# Patient Record
Sex: Male | Born: 1950 | Race: White | Hispanic: No | Marital: Married | Smoking: Never smoker
Health system: Southern US, Community
[De-identification: ages and names within clinical notes are randomized; demographics above are authoritative.]

## PROBLEM LIST (undated history)

## (undated) DIAGNOSIS — I10 Essential (primary) hypertension: Secondary | ICD-10-CM

## (undated) DIAGNOSIS — K219 Gastro-esophageal reflux disease without esophagitis: Secondary | ICD-10-CM

## (undated) HISTORY — PX: COLONOSCOPY, ESOPHAGOGASTRODUODENOSCOPY (EGD) AND ESOPHAGEAL DILATION: SHX5781

---

## 2004-04-13 ENCOUNTER — Ambulatory Visit (HOSPITAL_COMMUNITY): Admission: RE | Admit: 2004-04-13 | Discharge: 2004-04-13 | Payer: Self-pay | Admitting: Internal Medicine

## 2004-05-23 ENCOUNTER — Ambulatory Visit: Payer: Self-pay | Admitting: Internal Medicine

## 2004-06-07 ENCOUNTER — Ambulatory Visit: Payer: Self-pay | Admitting: Internal Medicine

## 2004-06-07 ENCOUNTER — Ambulatory Visit (HOSPITAL_COMMUNITY): Admission: RE | Admit: 2004-06-07 | Discharge: 2004-06-07 | Payer: Self-pay | Admitting: Internal Medicine

## 2004-08-08 ENCOUNTER — Ambulatory Visit: Payer: Self-pay | Admitting: Internal Medicine

## 2005-09-28 ENCOUNTER — Ambulatory Visit: Payer: Self-pay | Admitting: Internal Medicine

## 2008-12-14 ENCOUNTER — Ambulatory Visit (HOSPITAL_COMMUNITY): Admission: RE | Admit: 2008-12-14 | Discharge: 2008-12-14 | Payer: Self-pay | Admitting: Internal Medicine

## 2008-12-18 ENCOUNTER — Ambulatory Visit (HOSPITAL_COMMUNITY): Admission: RE | Admit: 2008-12-18 | Discharge: 2008-12-18 | Payer: Self-pay | Admitting: Internal Medicine

## 2009-03-16 ENCOUNTER — Ambulatory Visit (HOSPITAL_COMMUNITY): Admission: RE | Admit: 2009-03-16 | Discharge: 2009-03-16 | Payer: Self-pay | Admitting: Internal Medicine

## 2010-03-31 ENCOUNTER — Ambulatory Visit (HOSPITAL_COMMUNITY): Admission: RE | Admit: 2010-03-31 | Discharge: 2010-03-31 | Payer: Self-pay | Admitting: Family Medicine

## 2010-04-05 ENCOUNTER — Ambulatory Visit (HOSPITAL_COMMUNITY): Admission: RE | Admit: 2010-04-05 | Discharge: 2010-04-05 | Payer: Self-pay | Admitting: Family Medicine

## 2010-04-26 ENCOUNTER — Ambulatory Visit (HOSPITAL_COMMUNITY): Admission: RE | Admit: 2010-04-26 | Discharge: 2010-04-26 | Payer: Self-pay | Admitting: Orthopaedic Surgery

## 2010-08-30 LAB — DIFFERENTIAL
Basophils Absolute: 0 10*3/uL (ref 0.0–0.1)
Basophils Relative: 0 % (ref 0–1)
Eosinophils Absolute: 0.2 10*3/uL (ref 0.0–0.7)
Eosinophils Relative: 2 % (ref 0–5)
Lymphocytes Relative: 29 % (ref 12–46)
Lymphs Abs: 2.6 10*3/uL (ref 0.7–4.0)
Monocytes Absolute: 0.7 10*3/uL (ref 0.1–1.0)
Monocytes Relative: 8 % (ref 3–12)
Neutro Abs: 5.5 10*3/uL (ref 1.7–7.7)
Neutrophils Relative %: 60 % (ref 43–77)

## 2010-08-30 LAB — SURGICAL PCR SCREEN
MRSA, PCR: NEGATIVE
Staphylococcus aureus: NEGATIVE

## 2010-08-30 LAB — COMPREHENSIVE METABOLIC PANEL
ALT: 32 U/L (ref 0–53)
AST: 22 U/L (ref 0–37)
Albumin: 4.3 g/dL (ref 3.5–5.2)
Alkaline Phosphatase: 65 U/L (ref 39–117)
BUN: 12 mg/dL (ref 6–23)
CO2: 27 mEq/L (ref 19–32)
Calcium: 9.8 mg/dL (ref 8.4–10.5)
Chloride: 101 mEq/L (ref 96–112)
Creatinine, Ser: 1.14 mg/dL (ref 0.4–1.5)
GFR calc Af Amer: >60 mL/min (ref >60)
GFR calc non Af Amer: >60 mL/min (ref >60)
Glucose, Bld: 109 mg/dL — ABNORMAL HIGH (ref 70–99)
Potassium: 4 mEq/L (ref 3.5–5.1)
Sodium: 135 mEq/L (ref 135–145)
Total Bilirubin: 0.6 mg/dL (ref 0.3–1.2)
Total Protein: 7.3 g/dL (ref 6.0–8.3)

## 2010-08-30 LAB — URINALYSIS, ROUTINE W REFLEX MICROSCOPIC
Bilirubin Urine: NEGATIVE
Glucose, UA: NEGATIVE mg/dL
Hgb urine dipstick: NEGATIVE
Ketones, ur: NEGATIVE mg/dL
Nitrite: NEGATIVE
Protein, ur: NEGATIVE mg/dL
Specific Gravity, Urine: 1.01 (ref 1.005–1.030)
Urobilinogen, UA: 0.2 mg/dL (ref 0.0–1.0)
pH: 5.5 (ref 5.0–8.0)

## 2010-08-30 LAB — CBC
HCT: 43.6 % (ref 39.0–52.0)
Hemoglobin: 14.7 g/dL (ref 13.0–17.0)
MCH: 32.3 pg (ref 26.0–34.0)
MCHC: 33.7 g/dL (ref 30.0–36.0)
MCV: 96.1 fL (ref 78.0–100.0)
Platelets: 273 10*3/uL (ref 150–400)
RBC: 4.54 MIL/uL (ref 4.22–5.81)
RDW: 13.8 % (ref 11.5–15.5)
WBC: 9.1 10*3/uL (ref 4.0–10.5)

## 2010-11-04 NOTE — Op Note (Signed)
NAME:  Derek Ali, Derek Ali                 ACCOUNT NO.:  [id number]   MEDICAL RECORD NO.:  [id number]          PATIENT TYPE:  AMB   LOCATION:  DAY                           FACILITY:  [facility]   PHYSICIAN:  Lionel December, M.D.    DATE OF BIRTH:  [birth year]   DATE OF PROCEDURE:  06/07/2004  DATE OF DISCHARGE:                                 OPERATIVE REPORT   PROCEDURE:  Esophagogastroduodenoscopy with esophageal dilation followed  total colonoscopy with polypectomy.   INDICATIONS:  The patient is 60 year old Caucasian male who has chronic GERD  who now presents with dysphagia primarily to solids but at times he has  difficulty with liquids. He has history of distal esophageal ring and was  last dilated in July 2000. He will also undergo screening colonoscopy.  Family history is negative for colorectal carcinoma. Procedure and risks  were reviewed with the patient and informed consent was obtained.   PREOPERATIVE MEDICATIONS:  Cetacaine spray for pharyngeal topical  anesthesia, Demerol 50 mg IV, Versed 9 IV mg in divided doses.   FINDINGS:  Procedure performed in endoscopy suite. The patient's vital signs  and O2 saturations were monitored during the procedure and remained stable.   PROCEDURE #1:  Esophagogastroduodenoscopy. The patient was placed in left  lateral position, and Olympus video scope was passed via oropharynx without  any difficulty into the esophagus. There was fine wrinkling to mucosa of the  esophagus and body, suspicious for eosinophilic esophagitis. There was  incomplete ring at the GE junction which was at 38 cm. Hiatus was at 40 cm.   Stomach:  It was empty and distended very well with insufflation. Folds of  the proximal stomach were normal. Examination of mucosa at body, antrum,  pyloric channel as well as angularis, fundus, and cardia was normal.   Duodenum:  Bulbar mucosa was normal. The scope was passed to the second part  of the duodenum. Mucosa and folds were  normal. Endoscope was withdrawn.   Esophagus was dilated by passing 56-French Maloney dilator to full  insertion. As the dilator was withdrawn, endoscope was passed again. There  was about 2-cm tear from 25 to 27 cm from the incisors. The 2 other tears  were much smaller, located at and just above GE junction. Pictures taken for  the record. Endoscope was withdrawn, and the patient prepared for procedure  #2.   PROCEDURE #2. Colonoscopy. Rectal examination performed. No abnormality  noted on external or digital exam. Olympus video scope was placed in the  rectum and advanced into sigmoid colon and beyond. Preparation was  satisfactory. Scope was advanced to the cecum which was identified by  appendiceal orifice and ileocecal valve. There was a 6 to 7 mm sessile polyp  next to appendiceal orifice. This was raised with submucosal injection of  normal saline and snared. Part of it was snared; the rest was left but was  easily coagulated using snares. Another 5-mm flat polyp was noted at the  hepatic flexure which was snared and retrieved for histologic examination.  There were 2 small polyps at proximal sigmoid  colon, both of which were  coagulated using snare tip. Mucosa of the rest of the colon was normal. The  rectal mucosa similarly was normal. Scope was retroflexed to examine  anorectal junction, and small hemorrhoids were noted below the dentate line.  The scope was straightened and withdrawn. The patient tolerated the  procedure well.   FINAL DIAGNOSES:  1.  Distal esophageal ring and a small sliding hiatal hernia. Mucosa      appearance at body also suggests eosinophilic esophagitis. Esophagus      dilated by passing 56-French Maloney dilator resulting in mucosal      disruption at proximal body and distal esophagus.  2.  Normal examination of the stomach, first and second portion of the      duodenum.  3.  Two small polyps snared, one from cecum and second one from hepatitic       flexure.  4.  Two more polyps were coagulated, both of which were in sigmoid colon.  5.  Small external hemorrhoids.   RECOMMENDATIONS:  1.  He will be on full liquid diet for 24 hours.  2.  He will continue Nexium as before.  3.  Carafate liquid 1 g a.c. and q.h.s. for 2 weeks.  4.  He was given prescription for Lortab liquid 5 mg q.i.d. p.r.n.  5.  No aspirin for 10 days.  6.  I will be contacting patient with biopsy results and further      recommendations. I would plan to see him in the office in a couple of      months to make sure that his symptoms are completely resolved.      Otherwise, would consider therapy for possible eosinophilic esophagitis.     Naje   NR/MEDQ  D:  06/07/2004  T:  06/08/2004  Job:  [id number]   cc:   Madelin Rear. Sherwood Gambler, MD  [address]  [place]  [place] [address]  Fax: [phone]

## 2010-11-04 NOTE — Consult Note (Signed)
Derek Ali, Derek Ali                 ACCOUNT NO.:  [id number]   MEDICAL RECORD NO.:  [id number]          PATIENT TYPE:  AMB   LOCATION:  DAY                           FACILITY:  [facility]   PHYSICIAN:  Lionel December, M.D.    DATE OF BIRTH:  January 12, 1951   DATE OF CONSULTATION:  05/24/2004  DATE OF DISCHARGE:  04/13/2004                                   CONSULTATION   REFERRING PHYSICIAN:  Madelin Rear. Sherwood Gambler, MD   REASON FOR CONSULTATION:  Dysphagia.   HISTORY OF PRESENT ILLNESS:  Derek Ali is a 60 year old Caucasian male with  a two to three-month history of both solid and liquid dysphagia.  He notes a  sensation of food being stuck retrosternally.  He denies any regurgitation.  He did have indigestion and was recently started on Nexium 40 mg daily about  a month ago by Dr. Sherwood Gambler.  This has relieved 100% of his indigestion;  however, he still has residual dysphagia.  He denies any nausea or vomiting.  He denies any abdominal pain.  He has had EGD with esophageal dilatation in  the past by Dr. Karilyn Cota.  He was found to have erosive reflux esophagitis and  two distal esophageal rings, which were dilated with a 54 [place] Maloney  dilator on January 07, 1999.  He did report resolution of his dysphagia post  initial dilatation.  Serum H. pylori was negative on December 29, 1998.   As far as his bowel movements are concerned, he does have occasional,  infrequent diarrhea.  Denies any history of constipation.  He typically has  daily bowel movements without any rectal bleeding or melena.  He has never  had a screening colonoscopy.   PAST MEDICAL HISTORY:  1.  Remote peptic ulcer disease in 1981.  2.  An esophageal foreign body in 1994.  3.  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 Karilyn Cota in July 2000.  4.  Chronic GERD and erosive reflux esophagitis.   PAST SURGICAL HISTORY:  He denies any.   CURRENT MEDICATIONS:  1.  Nexium 40 mg daily.  2.  Tylenol as needed.   ALLERGIES:  No known  drug allergies.   FAMILY HISTORY:  No known family history of colorectal carcinoma or other  chronic GI problems.  Mother age 17 with history of asthma, hypertension.  Father age 34, alive, with history of hypertension.  He has one sister and  one brother, who are relatively healthy.   SOCIAL HISTORY:  Derek Ali has been married for 23 years.  He has two  children, ages 31 and 20, both of whom are healthy.  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  He denies any tobacco,  alcohol, or drug use.   REVIEW OF SYSTEMS:  CONSTITUTIONAL:  Weight is stable.  Denies any anorexia  or early satiety.  Is complaining of some fatigue.  Denies any fever or  chills.  CARDIOVASCULAR:  Denies any chest pain or palpitations.  PULMONARY:  Denies any shortness of breath, dyspnea, cough, or  hemoptysis.  HEENT:  Has  had some recent nasal congestion and scratchy sore throat, which is  resolving at this time.  GASTROINTESTINAL:  See HPI.   PHYSICAL EXAMINATION:  VITAL SIGNS:  Weight 174 pounds.  Temperature 97.7,  blood pressure 150/90, pulse 84.  GENERAL:  Derek Ali is a 60 year old Caucasian male who is alert and  oriented, pleasant and cooperative, in no acute distress.  HEENT:  Sclerae are clear and nonicteric.  Conjunctivae pink.  Oropharynx  pink and moist without any lesion.  NECK:  Supple without any mass.  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  Neck is supple.  No adenopathy.  CHEST:  Heart regular rate and rhythm with normal S1, S2, without any  murmurs, thrills, rubs, or gallops.  Lungs clear to auscultation  bilaterally.  ABDOMEN:  Positive bowel sounds x4.  No bruits are auscultated.  Soft,  nontender, nondistended, without palpable mass or hepatosplenomegaly.  Liver  exam is limited due to patient's body habitus.  No rebound tenderness or  guarding.  Negative Murphy's sign.  EXTREMITIES:  2+ pedal pulses bilaterally.  No edema.  SKIN:  Pink,  warm, and dry, without rash or jaundice.   ASSESSMENT:  Derek Ali is a 60 year old Caucasian male with chronic  gastroesophageal reflux disease, who now presents with dysphagia to both  liquids and solids, which has worsened over the last two to three months.  He did have poorly-controlled gastroesophageal reflux disease and began  proton pump inhibitor therapy about a month ago, which has helped with his  indigestion; however, his dysphagia persists.  He does have a history of  erosive reflux esophagitis and two distal esophageal rings.  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 Sherwood Gambler evaluate this.   RECOMMENDATIONS:  1.  Will schedule EGD with possible ED with Dr. Karilyn Cota in the very near      future.  I have discussed the procedure and the risks and benefits,      including but not limited to bleeding, infection, perforation and allerg      to meds. and he agrees.  A signed consent will be obtained.  2.  He is also requesting screening colonoscopy at the same time given his      age, and this is appropriate.  3.  He is to continue Nexium 40 mg daily for now.  4.  Further recommendations pending procedure.     Kand   KC/MEDQ  D:  05/24/2004  T:  05/24/2004  Job:  [id number]   cc:   Madelin Rear. Sherwood Gambler, MD  [address]  [place]  [place] [address]  Fax: [phone]

## 2016-02-08 ENCOUNTER — Ambulatory Visit (INDEPENDENT_AMBULATORY_CARE_PROVIDER_SITE_OTHER): Payer: 59

## 2016-02-08 ENCOUNTER — Encounter: Payer: Self-pay | Admitting: Orthopaedic Surgery

## 2016-02-08 ENCOUNTER — Ambulatory Visit (INDEPENDENT_AMBULATORY_CARE_PROVIDER_SITE_OTHER): Payer: 59 | Admitting: Orthopaedic Surgery

## 2016-02-08 ENCOUNTER — Ambulatory Visit: Payer: 59

## 2016-02-08 VITALS — BP 138/84 | HR 68 | Ht 60.0 in | Wt 166.0 lb

## 2016-02-08 DIAGNOSIS — M79645 Pain in left finger(s): Secondary | ICD-10-CM | POA: Diagnosis not present

## 2016-02-08 NOTE — Progress Notes (Signed)
Subjective: My left thumb has swelling    Patient ID: Derek Ali, male    DOB: 06-08-1951, 65 y.o.   MRN: [id number]  HPI He has developed a small cyst of the left nondominant thumb near the IP joint over the last six months.  Originally it came and went.  Over the last six weeks it has remained and is a little larger. It prevents him from fully flexing his thumb.  He has no numbness or redness.  He denies any trauma.  He has not really done anything for it.   Review of Systems  HENT: Negative for congestion.   Respiratory: Negative for cough and shortness of breath.   Cardiovascular: Negative for chest pain and leg swelling.  Endocrine: Positive for cold intolerance.  Musculoskeletal: Positive for arthralgias.  Allergic/Immunologic: Positive for environmental allergies.   No past medical history on file.  No past surgical history on file.  No current outpatient prescriptions on file prior to visit.   No current facility-administered medications on file prior to visit.     Social History   Social History  . Marital status: Married    Spouse name: N/A  . Number of children: N/A  . Years of education: N/A   Occupational History  . Not on file.   Social History Main Topics  . Smoking status: Never Smoker  . Smokeless tobacco: Not on file  . Alcohol use Not on file  . Drug use: Unknown  . Sexual activity: Not on file   Other Topics Concern  . Not on file   Social History Narrative  . No narrative on file    Family History  Problem Relation Age of Onset  . Diabetes Mother     BP 138/84   Pulse 68   Ht 5' (1.524 m)   Wt 166 lb (75.3 kg)   BMI 32.42 kg/m       Objective:   Physical Exam  Constitutional: He is oriented to person, place, and time. He appears well-developed and well-nourished.  HENT:  Head: Normocephalic and atraumatic.  Eyes: Conjunctivae and EOM are normal. Pupils are equal, round, and reactive to light.  Neck: Normal range of  motion. Neck supple.  Cardiovascular: Normal rate, regular rhythm and intact distal pulses.   Pulmonary/Chest: Effort normal.  Abdominal: Soft.  Musculoskeletal: He exhibits tenderness (There is a small English pea cyst on the ulnar side of the dorsal left thumb just distal to the IP joint.  It is not red.  ROM of the thumb lacks about 10 degrees of full flexion.).  Neurological: He is alert and oriented to person, place, and time. He has normal reflexes. He displays normal reflexes. No cranial nerve deficit. He exhibits normal muscle tone. Coordination normal.  Skin: Skin is warm and dry.  Psychiatric: He has a normal mood and affect. His behavior is normal. Judgment and thought content normal.  Vitals reviewed.  X-rays of the thumb were done and reported separately.       Assessment & Plan:   Encounter Diagnosis  Name Primary?  . Pain of left thumb Yes    Procedure note: After sterile prep and permission from the patient, the cyst was aspirated of gelatinous fluid about 1 1/2 cc by sterile technique tolerated well.  I told him it could recur.  He has some degenerative changes of the IP joint.  I will have him return in two weeks.  Call if any problem.  Precautions discussed.  Electronically Signed Sanjuana Kava, MD 8/22/201711:50 AM

## 2016-02-23 ENCOUNTER — Ambulatory Visit (INDEPENDENT_AMBULATORY_CARE_PROVIDER_SITE_OTHER): Payer: 59 | Admitting: Orthopaedic Surgery

## 2016-02-23 ENCOUNTER — Encounter: Payer: Self-pay | Admitting: Orthopaedic Surgery

## 2016-02-23 VITALS — BP 136/89 | HR 75 | Temp 97.7°F | Ht 65.0 in | Wt 169.0 lb

## 2016-02-23 DIAGNOSIS — M79645 Pain in left finger(s): Secondary | ICD-10-CM

## 2016-02-23 NOTE — Progress Notes (Signed)
Patient [id number] Derek Ali, male DOB:[birth year], 65 y.o. [id number]  Chief Complaint  Patient presents with  . Follow-up    Cyst on left thumb    HPI  Derek Ali is a 65 y.o. male who has a small cyst on the left thumb near the ulnar side of the distal portion of the proximal phalanx.  I aspirated it last month.  There is the small remnant of the cyst left but he has no pain, no swelling and much better motion.  I told him to watch it and I will see him as needed. HPI  Body mass index is 28.12 kg/m.  ROS  Review of Systems  HENT: Negative for congestion.   Respiratory: Negative for cough and shortness of breath.   Cardiovascular: Negative for chest pain and leg swelling.  Endocrine: Positive for cold intolerance.  Musculoskeletal: Positive for arthralgias.  Allergic/Immunologic: Positive for environmental allergies.    No past medical history on file.  No past surgical history on file.  Family History  Problem Relation Age of Onset  . Diabetes Mother     Social History Social History  Substance Use Topics  . Smoking status: Never Smoker  . Smokeless tobacco: Never Used  . Alcohol use Not on file    No Known Allergies  Current Outpatient Prescriptions  Medication Sig Dispense Refill  . aspirin EC 81 MG tablet Take 81 mg by mouth daily.    Marland Kitchen esomeprazole (NEXIUM) 20 MG capsule Take 20 mg by mouth daily at 12 noon.    . ramipril (ALTACE) 5 MG capsule Take 5 mg by mouth daily.     No current facility-administered medications for this visit.      Physical Exam  Blood pressure 136/89, pulse 75, temperature 97.7 F (36.5 C), height 5\' 5"  (1.651 m), weight 169 lb (76.7 kg).  Constitutional: overall normal hygiene, normal nutrition, well developed, normal grooming, normal body habitus. Assistive device:none  Musculoskeletal: gait and station Limp none, muscle tone and strength are normal, no tremors or atrophy is present.  .  Neurological: coordination  overall normal.  Deep tendon reflex/nerve stretch intact.  Sensation normal.  Cranial nerves II-XII intact.   Skin:   normal overall no scars, lesions, ulcers or rashes. No psoriasis.  Psychiatric: Alert and oriented x 3.  Recent memory intact, remote memory unclear.  Normal mood and affect. Well groomed.  Good eye contact.  Cardiovascular: overall no swelling, no varicosities, no edema bilaterally, normal temperatures of the legs and arms, no clubbing, cyanosis and good capillary refill.  Lymphatic: palpation is normal.  The left thumb on the distal portion of the proximal phalanx near the IP joint has just small remnant of the cyst.  He has full motion, no pain, no redness.  NV intact.  The patient has been educated about the nature of the problem(s) and counseled on treatment options.  The patient appeared to understand what I have discussed and is in agreement with it.  Encounter Diagnosis  Name Primary?  . Pain of left thumb Yes    PLAN Call if any problems.  Precautions discussed.  Continue current medications.   Return to clinic as needed.   Electronically Signed Sanjuana Kava, MD 9/6/20178:22 AM

## 2016-08-29 ENCOUNTER — Encounter (INDEPENDENT_AMBULATORY_CARE_PROVIDER_SITE_OTHER): Payer: Self-pay

## 2016-08-29 ENCOUNTER — Encounter (INDEPENDENT_AMBULATORY_CARE_PROVIDER_SITE_OTHER): Payer: Self-pay | Admitting: *Deleted

## 2018-06-28 ENCOUNTER — Encounter (INDEPENDENT_AMBULATORY_CARE_PROVIDER_SITE_OTHER): Payer: Self-pay | Admitting: *Deleted

## 2018-07-24 ENCOUNTER — Other Ambulatory Visit (INDEPENDENT_AMBULATORY_CARE_PROVIDER_SITE_OTHER): Payer: Self-pay | Admitting: *Deleted

## 2018-07-24 DIAGNOSIS — Z1211 Encounter for screening for malignant neoplasm of colon: Secondary | ICD-10-CM

## 2018-10-23 ENCOUNTER — Telehealth (INDEPENDENT_AMBULATORY_CARE_PROVIDER_SITE_OTHER): Payer: Self-pay | Admitting: *Deleted

## 2018-10-23 ENCOUNTER — Encounter (INDEPENDENT_AMBULATORY_CARE_PROVIDER_SITE_OTHER): Payer: Self-pay | Admitting: *Deleted

## 2018-10-23 NOTE — Telephone Encounter (Signed)
Error   This encounter was created in error - please disregard. 

## 2018-10-23 NOTE — Telephone Encounter (Signed)
Patient needs plenvu 

## 2018-10-24 MED ORDER — PEG-KCL-NACL-NASULF-NA ASC-C 140 G PO SOLR: 1.0000 | Freq: Once | ORAL | 0 refills | Status: AC

## 2018-11-18 ENCOUNTER — Telehealth (INDEPENDENT_AMBULATORY_CARE_PROVIDER_SITE_OTHER): Payer: Self-pay | Admitting: *Deleted

## 2018-11-18 NOTE — Telephone Encounter (Signed)
Referring MD/PCP: tcs   Procedure: screening  Reason/Indication:  Yes, 2004  Has patient had this procedure before?  no  If so, when, by whom and where?    Is there a family history of colon cancer?  no  Who?  What age when diagnosed?    Is patient diabetic?   no      Does patient have prosthetic heart valve or mechanical valve?  no  Do you have a pacemaker?  no  Has patient ever had endocarditis? no  Has patient had joint replacement within last 12 months?  no  Is patient constipated or do they take laxatives? no  Does patient have a history of alcohol/drug use?  no  Is patient on blood thinner such as Coumadin, Plavix and/or Aspirin? yes  Medications: asa 81 mg daily, ramipril 5 mg daily, nexium 20 mg daily  Allergies: nkda  Medication Adjustment per Dr Lindi Adie, NP: asa  2 days  Procedure date & time: 12/05/18 at 730

## 2018-11-18 NOTE — Telephone Encounter (Signed)
agree

## 2018-12-02 ENCOUNTER — Other Ambulatory Visit (HOSPITAL_COMMUNITY)
Admission: RE | Admit: 2018-12-02 | Discharge: 2018-12-02 | Disposition: A | Discharge status: Home / Self Care | Payer: 59 | Source: Ambulatory Visit | Attending: Internal Medicine | Admitting: Internal Medicine

## 2018-12-02 ENCOUNTER — Other Ambulatory Visit: Payer: Self-pay

## 2018-12-02 DIAGNOSIS — Z1159 Encounter for screening for other viral diseases: Secondary | ICD-10-CM | POA: Insufficient documentation

## 2018-12-03 LAB — NOVEL CORONAVIRUS, NAA (HOSP ORDER, SEND-OUT TO REF LAB; TAT 18-24 HRS): SARS-CoV-2, NAA: NOT DETECTED

## 2018-12-05 ENCOUNTER — Encounter (HOSPITAL_COMMUNITY): Payer: Self-pay | Admitting: *Deleted

## 2018-12-05 ENCOUNTER — Other Ambulatory Visit: Payer: Self-pay

## 2018-12-05 ENCOUNTER — Encounter (HOSPITAL_COMMUNITY)
Admission: RE | Disposition: A | Discharge status: Home / Self Care | Payer: Self-pay | Source: Home / Self Care | Attending: Internal Medicine

## 2018-12-05 ENCOUNTER — Ambulatory Visit (HOSPITAL_COMMUNITY)
Admission: RE | Admit: 2018-12-05 | Discharge: 2018-12-05 | Disposition: A | Discharge status: Home / Self Care | Payer: 59 | Attending: Internal Medicine | Admitting: Internal Medicine

## 2018-12-05 DIAGNOSIS — Z8719 Personal history of other diseases of the digestive system: Secondary | ICD-10-CM | POA: Diagnosis not present

## 2018-12-05 DIAGNOSIS — I10 Essential (primary) hypertension: Secondary | ICD-10-CM | POA: Insufficient documentation

## 2018-12-05 DIAGNOSIS — K219 Gastro-esophageal reflux disease without esophagitis: Secondary | ICD-10-CM | POA: Insufficient documentation

## 2018-12-05 DIAGNOSIS — Z1211 Encounter for screening for malignant neoplasm of colon: Secondary | ICD-10-CM | POA: Diagnosis present

## 2018-12-05 DIAGNOSIS — Z86010 Personal history of colonic polyps: Secondary | ICD-10-CM | POA: Insufficient documentation

## 2018-12-05 DIAGNOSIS — K648 Other hemorrhoids: Secondary | ICD-10-CM | POA: Insufficient documentation

## 2018-12-05 DIAGNOSIS — Q438 Other specified congenital malformations of intestine: Secondary | ICD-10-CM | POA: Insufficient documentation

## 2018-12-05 DIAGNOSIS — Z09 Encounter for follow-up examination after completed treatment for conditions other than malignant neoplasm: Secondary | ICD-10-CM

## 2018-12-05 DIAGNOSIS — Z79899 Other long term (current) drug therapy: Secondary | ICD-10-CM | POA: Diagnosis not present

## 2018-12-05 DIAGNOSIS — Z7982 Long term (current) use of aspirin: Secondary | ICD-10-CM | POA: Diagnosis not present

## 2018-12-05 HISTORY — PX: COLONOSCOPY: SHX5424

## 2018-12-05 HISTORY — DX: Gastro-esophageal reflux disease without esophagitis: K21.9

## 2018-12-05 HISTORY — DX: Essential (primary) hypertension: I10

## 2018-12-05 SURGERY — COLONOSCOPY
Anesthesia: Moderate Sedation

## 2018-12-05 MED ORDER — STERILE WATER FOR IRRIGATION IR SOLN
Status: DC | PRN
  Administered 2018-12-05: 5 mL

## 2018-12-05 MED ORDER — MEPERIDINE HCL 50 MG/ML IJ SOLN
INTRAMUSCULAR | Status: DC | PRN
  Administered 2018-12-05 (×2): 25 mg via INTRAVENOUS

## 2018-12-05 MED ORDER — MEPERIDINE HCL 50 MG/ML IJ SOLN
INTRAMUSCULAR | Status: AC
  Filled 2018-12-05: qty 1

## 2018-12-05 MED ORDER — MIDAZOLAM HCL 5 MG/5ML IJ SOLN
INTRAMUSCULAR | Status: AC
  Filled 2018-12-05: qty 10

## 2018-12-05 MED ORDER — MIDAZOLAM HCL 5 MG/5ML IJ SOLN
INTRAMUSCULAR | Status: DC | PRN
  Administered 2018-12-05 (×2): 1 mg via INTRAVENOUS
  Administered 2018-12-05: 2 mg via INTRAVENOUS
  Administered 2018-12-05: 1 mg via INTRAVENOUS
  Administered 2018-12-05: 2 mg via INTRAVENOUS

## 2018-12-05 MED ORDER — SODIUM CHLORIDE 0.9 % IV SOLN
INTRAVENOUS | Status: DC
  Administered 2018-12-05: 07:00:00 via INTRAVENOUS

## 2018-12-05 NOTE — Discharge Instructions (Signed)
Hemorrhoids Hemorrhoids are swollen veins that may develop:  In the butt (rectum). These are called internal hemorrhoids.  Around the opening of the butt (anus). These are called external hemorrhoids. Hemorrhoids can cause pain, itching, or bleeding. Most of the time, they do not cause serious problems. They usually get better with diet changes, lifestyle changes, and other home treatments. What are the causes? This condition may be caused by:  Having trouble pooping (constipation).  Pushing hard (straining) to poop.  Watery poop (diarrhea).  Pregnancy.  Being very overweight (obese).  Sitting for long periods of time.  Heavy lifting or other activity that causes you to strain.  Anal sex.  Riding a bike for a long period of time. What are the signs or symptoms? Symptoms of this condition include:  Pain.  Itching or soreness in the butt.  Bleeding from the butt.  Leaking poop.  Swelling in the area.  One or more lumps around the opening of your butt. How is this diagnosed? A doctor can often diagnose this condition by looking at the affected area. The doctor may also:  Do an exam that involves feeling the area with a gloved hand (digital rectal exam).  Examine the area inside your butt using a small tube (anoscope).  Order blood tests. This may be done if you have lost a lot of blood.  Have you get a test that involves looking inside the colon using a flexible tube with a camera on the end (sigmoidoscopy or colonoscopy). How is this treated? This condition can usually be treated at home. Your doctor may tell you to change what you eat, make lifestyle changes, or try home treatments. If these do not help, procedures can be done to remove the hemorrhoids or make them smaller. These may involve:  Placing rubber bands at the base of the hemorrhoids to cut off their blood supply.  Injecting medicine into the hemorrhoids to shrink them.  Shining a type of light  energy onto the hemorrhoids to cause them to fall off.  Doing surgery to remove the hemorrhoids or cut off their blood supply. Follow these instructions at home: Eating and drinking   Eat foods that have a lot of fiber in them. These include whole grains, beans, nuts, fruits, and vegetables.  Ask your doctor about taking products that have added fiber (fibersupplements).  Reduce the amount of fat in your diet. You can do this by: ? Eating low-fat dairy products. ? Eating less red meat. ? Avoiding processed foods.  Drink enough fluid to keep your pee (urine) pale yellow. Managing pain and swelling   Take a warm-water bath (sitz bath) for 20 minutes to ease pain. Do this 3-4 times a day. You may do this in a bathtub or using a portable sitz bath that fits over the toilet.  If told, put ice on the painful area. It may be helpful to use ice between your warm baths. ? Put ice in a plastic bag. ? Place a towel between your skin and the bag. ? Leave the ice on for 20 minutes, 2-3 times a day. General instructions  Take over-the-counter and prescription medicines only as told by your doctor. ? Medicated creams and medicines may be used as told.  Exercise often. Ask your doctor how much and what kind of exercise is best for you.  Go to the bathroom when you have the urge to poop. Do not wait.  Avoid pushing too hard when you poop.  Keep your  butt dry and clean. Use wet toilet paper or moist towelettes after pooping.  Do not sit on the toilet for a long time.  Keep all follow-up visits as told by your doctor. This is important. Contact a doctor if you:  Have pain and swelling that do not get better with treatment or medicine.  Have trouble pooping.  Cannot poop.  Have pain or swelling outside the area of the hemorrhoids. Get help right away if you have:  Bleeding that will not stop. Summary  Hemorrhoids are swollen veins in the butt or around the opening of the  butt.  They can cause pain, itching, or bleeding.  Eat foods that have a lot of fiber in them. These include whole grains, beans, nuts, fruits, and vegetables.  Take a warm-water bath (sitz bath) for 20 minutes to ease pain. Do this 3-4 times a day. This information is not intended to replace advice given to you by your health care provider. Make sure you discuss any questions you have with your health care provider. Document Released: 03/14/2008 Document Revised: 10/25/2017 Document Reviewed: 10/25/2017 Elsevier Interactive Patient Education  2019 [place].   Colonoscopy, Adult, Care After This sheet gives you information about how to care for yourself after your procedure. Your health care provider may also give you more specific instructions. If you have problems or questions, contact your health care provider. What can I expect after the procedure? After the procedure, it is common to have:  A small amount of blood in your stool for 24 hours after the procedure.  Some gas.  Mild abdominal cramping or bloating. Follow these instructions at home: General instructions  For the first 24 hours after the procedure: ? Do not drive or use machinery. ? Do not sign important documents. ? Do not drink alcohol. ? Do your regular daily activities at a slower pace than normal. ? Eat soft, easy-to-digest foods.  Take over-the-counter or prescription medicines only as told by your health care provider. Relieving cramping and bloating   Try walking around when you have cramps or feel bloated.  Apply heat to your abdomen as told by your health care provider. Use a heat source that your health care provider recommends, such as a moist heat pack or a heating pad. ? Place a towel between your skin and the heat source. ? Leave the heat on for 20-30 minutes. ? Remove the heat if your skin turns bright red. This is especially important if you are unable to feel pain, heat, or cold. You may  have a greater risk of getting burned. Eating and drinking   Drink enough fluid to keep your urine pale yellow.  Resume your normal diet as instructed by your health care provider. Avoid heavy or fried foods that are hard to digest.  Avoid drinking alcohol for as long as instructed by your health care provider. Contact a health care provider if:  You have blood in your stool 2-3 days after the procedure. Get help right away if:  You have more than a small spotting of blood in your stool.  You pass large blood clots in your stool.  Your abdomen is swollen.  You have nausea or vomiting.  You have a fever.  You have increasing abdominal pain that is not relieved with medicine. Summary  After the procedure, it is common to have a small amount of blood in your stool. You may also have mild abdominal cramping and bloating.  For the first 24  hours after the procedure, do not drive or use machinery, sign important documents, or drink alcohol.  Contact your health care provider if you have a lot of blood in your stool, nausea or vomiting, a fever, or increased abdominal pain. This information is not intended to replace advice given to you by your health care provider. Make sure you discuss any questions you have with your health care provider. Document Released: 01/18/2004 Document Revised: 03/28/2017 Document Reviewed: 08/17/2015 Elsevier Interactive Patient Education  2019 [place] usual medications including aspirin as before. Resume usual diet. Can wait 10 years before next colonoscopy.

## 2018-12-05 NOTE — H&P (Signed)
Derek Ali is an 68 y.o. male.   Chief Complaint: Patient is here for colonoscopy. HPI: Patient is 68 year old Caucasian man with history of colonic polyps and is here for surveillance colonoscopy.  He has no GI symptoms whatsoever.  He denies abdominal pain change in bowel habits or rectal bleeding. His first colonoscopy was in 2004 with removal of 2 small polyps and there were tubular adenomas.  His last colonoscopy was in 2011 removal of 3 small polyps and one had adenomatous change.  Other polyps were hyperplastic.  He was advised to return in 7 years. Family history is negative for CRC.  Past Medical History:  Diagnosis Date  . GERD (gastroesophageal reflux disease)   . Hypertension     Past Surgical History:  Procedure Laterality Date  . COLONOSCOPY, ESOPHAGOGASTRODUODENOSCOPY (EGD) AND ESOPHAGEAL DILATION      Family History  Problem Relation Age of Onset  . Diabetes Mother    Social History:  reports that he has never smoked. He has never used smokeless tobacco. No history on file for alcohol and drug.  Allergies: No Known Allergies  Medications Prior to Admission  Medication Sig Dispense Refill  . ALTACE 5 MG capsule Take 5 mg by mouth at bedtime.    Marland Kitchen aspirin EC 81 MG tablet Take 81 mg by mouth at bedtime.     Marland Kitchen esomeprazole (NEXIUM) 20 MG capsule Take 20 mg by mouth daily with breakfast.       No results found for this or any previous visit (from the past 48 hour(s)). No results found.  ROS  Blood pressure (!) 145/82, pulse 84, temperature 98.8 F (37.1 C), temperature source Oral, resp. rate 18, height 5\' 4"  (1.626 m), weight 69.9 kg, SpO2 99 %. Physical Exam  Constitutional: He appears well-developed and well-nourished.  HENT:  Mouth/Throat: Oropharynx is clear and moist.  Eyes: Conjunctivae are normal. No scleral icterus.  Neck: No thyromegaly present.  Cardiovascular: Normal rate, regular rhythm and normal heart sounds.  No murmur heard. Respiratory:  Effort normal and breath sounds normal.  GI: Soft. He exhibits no distension and no mass. There is no abdominal tenderness.  Musculoskeletal:        General: No edema.  Lymphadenopathy:    He has no cervical adenopathy.  Neurological: He is alert.  Skin: Skin is warm and dry.     Assessment/Plan History of colonic adenomas. Surveillance colonoscopy.  Hildred Laser, MD 12/05/2018, 7:27 AM

## 2018-12-05 NOTE — Op Note (Signed)
[facility] [place] Patient Name: Derek Ali Procedure Date: 12/05/2018 7:12 AM MRN: [id number] Date of Birth: [birth year] Attending MD: Hildred Laser , MD CSN: [id number] Age: 68 Admit Type: Outpatient Procedure:                Colonoscopy Indications:              High risk colon cancer surveillance: Personal                            history of colonic polyps Providers:                Hildred Laser, MD, Otis Peak B. Sharon Seller, RN, Raphael Gibney, Technician Referring MD:             Redmond School, MD Medicines:                Meperidine 50 mg IV, Midazolam 7 mg IV Complications:            No immediate complications. Estimated Blood Loss:     Estimated blood loss: none. Procedure:                Pre-Anesthesia Assessment:                           - Prior to the procedure, a History and Physical                            was performed, and patient medications and                            allergies were reviewed. The patient's tolerance of                            previous anesthesia was also reviewed. The risks                            and benefits of the procedure and the sedation                            options and risks were discussed with the patient.                            All questions were answered, and informed consent                            was obtained. Prior Anticoagulants: The patient has                            taken no previous anticoagulant or antiplatelet                            agents except for aspirin. ASA Grade Assessment: I                            -  A normal, healthy patient. After reviewing the                            risks and benefits, the patient was deemed in                            satisfactory condition to undergo the procedure.                           After obtaining informed consent, the colonoscope                            was passed under direct vision. Throughout the      procedure, the patient's blood pressure, pulse, and                            oxygen saturations were monitored continuously. The                            [id number]-[id number] ([id number]) scope was introduced through                            the anus and advanced to the the cecum, identified                            by appendiceal orifice and ileocecal valve. The                            colonoscopy was somewhat difficult due to a                            tortuous colon. The patient tolerated the procedure                            well. The quality of the bowel preparation was                            excellent. The ileocecal valve, appendiceal                            orifice, and rectum were photographed. Scope In: 7:37:09 AM Scope Out: 2:42:35 AM Scope Withdrawal Time: 0 hours 8 minutes 44 seconds  Total Procedure Duration: 0 hours 19 minutes 36 seconds  Findings:      The perianal and digital rectal examinations were normal.      The colon (entire examined portion) appeared normal.      Internal hemorrhoids were found during retroflexion. The hemorrhoids       were small. Impression:               - The entire examined colon is normal.                           - Internal hemorrhoids.                           -  No specimens collected. Moderate Sedation:      Moderate (conscious) sedation was administered by the endoscopy nurse       and supervised by the endoscopist. The following parameters were       monitored: oxygen saturation, heart rate, blood pressure, CO2       capnography and response to care. Total physician intraservice time was       24 minutes. Recommendation:           - Patient has a contact number available for                            emergencies. The signs and symptoms of potential                            delayed complications were discussed with the                            patient. Return to normal activities tomorrow.                             Written discharge instructions were provided to the                            patient.                           - Resume previous diet today.                           - Continue present medications.                           - Repeat colonoscopy in 10 years. Procedure Code(s):        --- Professional ---                           [phone], Colonoscopy, flexible; diagnostic, including                            collection of specimen(s) by brushing or washing,                            when performed (separate procedure)                           99153, Moderate sedation; each additional 15                            minutes intraservice time                           G0500, Moderate sedation services provided by the                            same physician or other qualified health care  professional performing a gastrointestinal                            endoscopic service that sedation supports,                            requiring the presence of an independent trained                            observer to assist in the monitoring of the                            patient's level of consciousness and physiological                            status; initial 15 minutes of intra-service time;                            patient age 54 years or older (additional time may                            be reported with [phone], as appropriate) Diagnosis Code(s):        --- Professional ---                           Z86.010, Personal history of colonic polyps                           K64.8, Other hemorrhoids CPT copyright 2019 American [employer]. All rights reserved. The codes documented in this report are preliminary and upon coder review may  be revised to meet current compliance requirements. Hildred Laser, MD Hildred Laser, MD 12/05/2018 8:03:43 AM This report has been signed electronically. Number of Addenda: 0

## 2018-12-11 ENCOUNTER — Encounter (HOSPITAL_COMMUNITY): Payer: Self-pay | Admitting: Internal Medicine

## 2019-05-26 ENCOUNTER — Other Ambulatory Visit: Payer: Self-pay

## 2019-05-26 DIAGNOSIS — Z20822 Contact with and (suspected) exposure to covid-19: Secondary | ICD-10-CM

## 2019-05-27 LAB — NOVEL CORONAVIRUS, NAA: SARS-CoV-2, NAA: NOT DETECTED

## 2019-06-01 ENCOUNTER — Ambulatory Visit
Admission: EM | Admit: 2019-06-01 | Discharge: 2019-06-01 | Disposition: A | Discharge status: Home / Self Care | Payer: 59 | Attending: Emergency Medicine | Admitting: Emergency Medicine

## 2019-06-01 ENCOUNTER — Other Ambulatory Visit: Payer: Self-pay

## 2019-06-01 DIAGNOSIS — S0502XA Injury of conjunctiva and corneal abrasion without foreign body, left eye, initial encounter: Secondary | ICD-10-CM

## 2019-06-01 DIAGNOSIS — H5712 Ocular pain, left eye: Secondary | ICD-10-CM

## 2019-06-01 MED ORDER — OFLOXACIN 0.3 % OP SOLN: 2.0000 [drp] | Freq: Four times a day (QID) | OPHTHALMIC | 0 refills | Status: AC

## 2019-06-01 MED ORDER — HYPROMELLOSE 0.3 % OP GEL: OPHTHALMIC | 0 refills | Status: AC | PRN

## 2019-06-01 NOTE — ED Provider Notes (Signed)
[place]   [id number] 06/01/19 Arrival Time: [id number]  CC: Eye pain and discomfort  SUBJECTIVE:  Derek Ali is a 68 y.o. male who presents with complaint of left eye pain and irritation that began this morning.  Symptoms began after mowing.  Speculates he may have gotten something into eye, and then scratched his eye.  Has NOT tried OTC eye drops.  Symptoms are made worse with blinking.  Denies similar symptoms in the past.  Complains of clear drainage and FB sensation.  Denies fever, chills, nausea, vomiting, painful eye movements, itching, vision changes, double vision,  periorbital erythema.     Denies contact lens use.    ROS: As per HPI.  All other pertinent ROS negative.     Past Medical History:  Diagnosis Date  . GERD (gastroesophageal reflux disease)   . Hypertension    Past Surgical History:  Procedure Laterality Date  . COLONOSCOPY N/A 12/05/2018   Procedure: COLONOSCOPY;  Surgeon: Rogene Houston, MD;  Location: [facility];  Service: Endoscopy;  Laterality: N/A;  830  . COLONOSCOPY, ESOPHAGOGASTRODUODENOSCOPY (EGD) AND ESOPHAGEAL DILATION     No Known Allergies No current facility-administered medications on file prior to encounter.   Current Outpatient Medications on File Prior to Encounter  Medication Sig Dispense Refill  . ALTACE 5 MG capsule Take 5 mg by mouth at bedtime.    Marland Kitchen aspirin EC 81 MG tablet Take 81 mg by mouth at bedtime.     Marland Kitchen esomeprazole (NEXIUM) 20 MG capsule Take 20 mg by mouth daily with breakfast.      Social History   Socioeconomic History  . Marital status: Married    Spouse name: Not on file  . Number of children: Not on file  . Years of education: Not on file  . Highest education level: Not on file  Occupational History  . Not on file  Tobacco Use  . Smoking status: Never Smoker  . Smokeless tobacco: Never Used  Substance and Sexual Activity  . Alcohol use: Not on file  . Drug use: Not on file  . Sexual activity:  Not on file  Other Topics Concern  . Not on file  Social History Narrative  . Not on file   Social Determinants of Health   Financial Resource Strain:   . Difficulty of Paying Living Expenses: Not on file  Food Insecurity:   . Worried About Charity fundraiser in the Last Year: Not on file  . Ran Out of Food in the Last Year: Not on file  Transportation Needs:   . Lack of Transportation (Medical): Not on file  . Lack of Transportation (Non-Medical): Not on file  Physical Activity:   . Days of Exercise per Week: Not on file  . Minutes of Exercise per Session: Not on file  Stress:   . Feeling of Stress : Not on file  Social Connections:   . Frequency of Communication with Friends and Family: Not on file  . Frequency of Social Gatherings with Friends and Family: Not on file  . Attends Religious Services: Not on file  . Active Member of Clubs or Organizations: Not on file  . Attends Archivist Meetings: Not on file  . Marital Status: Not on file  Intimate Partner Violence:   . Fear of Current or Ex-Partner: Not on file  . Emotionally Abused: Not on file  . Physically Abused: Not on file  . Sexually Abused: Not on file  Family History  Problem Relation Age of Onset  . Diabetes Mother   . Healthy Father     OBJECTIVE:    Visual Acuity  Right Eye Distance: 20/25 Left Eye Distance: 20/16 Bilateral Distance: 20/25   Vitals:   06/01/19 1110  BP: (!) 158/82  Pulse: 73  Resp: 16  Temp: 98.2 F (36.8 C)  TempSrc: Oral  SpO2: 95%    General appearance: alert; no distress Eyes: Wears corrective lenses; mild conjunctival erythema. PERRL; EOMI without discomfort;  no obvious drainage; lid everted without obvious FB; fluorescein uptake in linear distribution in 3 o'clock position over pupil extending toward iris HENT: NCAT; EACs clear, TMs pearly gray; nares patent without rhinorrhea; oropharynx clear Neck: supple Lungs: clear to auscultation  bilaterally Heart: regular rate and rhythm Skin: warm and dry Psychological: alert and cooperative; normal mood and affect   ASSESSMENT & PLAN:  1. Discomfort of left eye   2. Abrasion of left cornea, initial encounter     Meds ordered this encounter  Medications  . ofloxacin (OCUFLOX) 0.3 % ophthalmic solution    Sig: Place 2 drops into the left eye 4 (four) times daily for 7 days.    Dispense:  5 mL    Refill:  0    Order Specific Question:   Supervising Provider    Answer:   Raylene Everts [id number]  . hypromellose (GENTEAL) 0.3 % GEL ophthalmic ointment    Sig: Place into the left eye every 3 (three) hours as needed.    Dispense:  10 g    Refill:  0    Order Specific Question:   Supervising Provider    Answer:   Raylene Everts [id number]   Use ofloxacin as prescribed and to completion Genteal eye drops as needed for symptomatic relief Use OTC ibuprofen or tylenol as needed for pain relief Return here or follow up with ophthamolgy if symptoms persists or worsen such as fever, chills, redness, swelling, eye pain, painful eye movements, vision changes, etc...  Reviewed expectations re: course of current medical issues. Questions answered. Outlined signs and symptoms indicating need for more acute intervention. Patient verbalized understanding. After Visit Summary given.   Lestine Box, PA-C 06/01/19 1333

## 2019-06-01 NOTE — ED Triage Notes (Signed)
Pt presents to UC w/ c/o scratchy left eye since this morning. Pt states he was mowing grass yesterday. Pt thinks he may have something in his left eye. Left eye is pink and tearful.

## 2019-06-01 NOTE — Discharge Instructions (Signed)
Use ofloxacin as prescribed and to completion Genteal eye drops as needed for symptomatic relief Use OTC ibuprofen or tylenol as needed for pain relief Return here or follow up with ophthamolgy if symptoms persists or worsen such as fever, chills, redness, swelling, eye pain, painful eye movements, vision changes, etc..Marland Kitchen

## 2020-01-27 DIAGNOSIS — G4733 Obstructive sleep apnea (adult) (pediatric): Secondary | ICD-10-CM | POA: Diagnosis not present

## 2020-01-27 DIAGNOSIS — Z85828 Personal history of other malignant neoplasm of skin: Secondary | ICD-10-CM | POA: Diagnosis not present

## 2020-01-27 DIAGNOSIS — I781 Nevus, non-neoplastic: Secondary | ICD-10-CM | POA: Diagnosis not present

## 2020-01-27 DIAGNOSIS — L814 Other melanin hyperpigmentation: Secondary | ICD-10-CM | POA: Diagnosis not present

## 2020-01-27 DIAGNOSIS — L570 Actinic keratosis: Secondary | ICD-10-CM | POA: Diagnosis not present

## 2020-02-23 ENCOUNTER — Emergency Department (HOSPITAL_COMMUNITY): Payer: PPO

## 2020-02-23 ENCOUNTER — Emergency Department (HOSPITAL_COMMUNITY)
Admission: EM | Admit: 2020-02-23 | Discharge: 2020-02-23 | Disposition: A | Discharge status: Home / Self Care | Payer: PPO | Attending: Emergency Medicine | Admitting: Emergency Medicine

## 2020-02-23 ENCOUNTER — Other Ambulatory Visit: Payer: Self-pay

## 2020-02-23 ENCOUNTER — Encounter (HOSPITAL_COMMUNITY): Payer: Self-pay | Admitting: Emergency Medicine

## 2020-02-23 DIAGNOSIS — Z043 Encounter for examination and observation following other accident: Secondary | ICD-10-CM | POA: Diagnosis not present

## 2020-02-23 DIAGNOSIS — Y939 Activity, unspecified: Secondary | ICD-10-CM | POA: Diagnosis not present

## 2020-02-23 DIAGNOSIS — S42212A Unspecified displaced fracture of surgical neck of left humerus, initial encounter for closed fracture: Secondary | ICD-10-CM | POA: Diagnosis not present

## 2020-02-23 DIAGNOSIS — W11XXXA Fall on and from ladder, initial encounter: Secondary | ICD-10-CM | POA: Diagnosis not present

## 2020-02-23 DIAGNOSIS — S50812A Abrasion of left forearm, initial encounter: Secondary | ICD-10-CM | POA: Insufficient documentation

## 2020-02-23 DIAGNOSIS — S42202A Unspecified fracture of upper end of left humerus, initial encounter for closed fracture: Secondary | ICD-10-CM

## 2020-02-23 DIAGNOSIS — I10 Essential (primary) hypertension: Secondary | ICD-10-CM | POA: Insufficient documentation

## 2020-02-23 DIAGNOSIS — S92352A Displaced fracture of fifth metatarsal bone, left foot, initial encounter for closed fracture: Secondary | ICD-10-CM | POA: Diagnosis not present

## 2020-02-23 DIAGNOSIS — Y999 Unspecified external cause status: Secondary | ICD-10-CM | POA: Insufficient documentation

## 2020-02-23 DIAGNOSIS — R0789 Other chest pain: Secondary | ICD-10-CM | POA: Diagnosis not present

## 2020-02-23 DIAGNOSIS — W19XXXA Unspecified fall, initial encounter: Secondary | ICD-10-CM | POA: Diagnosis not present

## 2020-02-23 DIAGNOSIS — Y929 Unspecified place or not applicable: Secondary | ICD-10-CM | POA: Insufficient documentation

## 2020-02-23 DIAGNOSIS — R609 Edema, unspecified: Secondary | ICD-10-CM | POA: Diagnosis not present

## 2020-02-23 DIAGNOSIS — S4991XA Unspecified injury of right shoulder and upper arm, initial encounter: Secondary | ICD-10-CM | POA: Diagnosis present

## 2020-02-23 DIAGNOSIS — S42292A Other displaced fracture of upper end of left humerus, initial encounter for closed fracture: Secondary | ICD-10-CM | POA: Diagnosis not present

## 2020-02-23 DIAGNOSIS — S42291A Other displaced fracture of upper end of right humerus, initial encounter for closed fracture: Secondary | ICD-10-CM | POA: Insufficient documentation

## 2020-02-23 DIAGNOSIS — R0781 Pleurodynia: Secondary | ICD-10-CM | POA: Diagnosis not present

## 2020-02-23 DIAGNOSIS — S92355A Nondisplaced fracture of fifth metatarsal bone, left foot, initial encounter for closed fracture: Secondary | ICD-10-CM | POA: Diagnosis not present

## 2020-02-23 DIAGNOSIS — R52 Pain, unspecified: Secondary | ICD-10-CM | POA: Diagnosis not present

## 2020-02-23 MED ORDER — OXYCODONE-ACETAMINOPHEN 5-325 MG PO TABS
1.0000 | ORAL_TABLET | Freq: Once | ORAL | Status: AC
  Administered 2020-02-23: 1 via ORAL
  Filled 2020-02-23: qty 1

## 2020-02-23 MED ORDER — IBUPROFEN 400 MG PO TABS
600.0000 mg | ORAL_TABLET | Freq: Once | ORAL | Status: AC
  Administered 2020-02-23: 600 mg via ORAL
  Filled 2020-02-23: qty 2

## 2020-02-23 MED ORDER — OXYCODONE-ACETAMINOPHEN 5-325 MG PO TABS: 1.0000 | ORAL_TABLET | Freq: Four times a day (QID) | ORAL | 0 refills | Status: AC | PRN

## 2020-02-23 NOTE — ED Provider Notes (Signed)
[facility] EMERGENCY DEPARTMENT Provider Note   CSN: [id number] Arrival date & time: 02/23/20  1110  History Chief Complaint  Patient presents with  . Fall  . Shoulder Pain    Derek Ali is a 69 y.o. male.  HPI   69 year old male with left shoulder left-sided chest pain.  He lost his balance and fell from a ladder prior to arrival.  He estimates that he fell approximately 8 feet.  He struck his left shoulder/chest against the ladder.  He has had severe persistent pain since.  Denies any significant headache or neck pain.  Abrasion to his left forearm.  Reports tetanus is up-to-date.  Has been ambulatory since the accident without any significant back or lower extremity pain.  Past Medical History:  Diagnosis Date  . GERD (gastroesophageal reflux disease)   . Hypertension    Patient Active Problem List   Diagnosis Date Noted  . Special screening for malignant neoplasms, colon 07/24/2018   Past Surgical History:  Procedure Laterality Date  . COLONOSCOPY N/A 12/05/2018   Procedure: COLONOSCOPY;  Surgeon: Rogene Houston, MD;  Location: [facility];  Service: Endoscopy;  Laterality: N/A;  830  . COLONOSCOPY, ESOPHAGOGASTRODUODENOSCOPY (EGD) AND ESOPHAGEAL DILATION       Family History  Problem Relation Age of Onset  . Diabetes Mother   . Healthy Father    Social History   Tobacco Use  . Smoking status: Never Smoker  . Smokeless tobacco: Never Used  Substance Use Topics  . Alcohol use: Never  . Drug use: Never   Home Medications Prior to Admission medications   Medication Sig Start Date End Date Taking? Authorizing Provider  ALTACE 5 MG capsule Take 5 mg by mouth at bedtime. 11/08/18  Yes [provider]  esomeprazole (NEXIUM) 20 MG capsule Take 20 mg by mouth daily with breakfast.    Yes [provider]  Omega-3 Fatty Acids (FISH OIL) 1000 MG CAPS Take 1 capsule by mouth daily.   Yes [provider]  hypromellose (GENTEAL) 0.3 % GEL  ophthalmic ointment Place into the left eye every 3 (three) hours as needed. Patient not taking: Reported on 02/23/2020 06/01/19   Lestine Box, PA-C   Allergies    Patient has no known allergies.  Review of Systems   Review of Systems All systems reviewed and negative, other than as noted in HPI.  Physical Exam Updated Vital Signs BP 133/84 (BP Location: Right Arm)   Pulse (!) 57   Temp 98.7 F (37.1 C) (Oral)   Resp 16   Ht 5\' 4"  (1.626 m)   Wt 73.5 kg   SpO2 98%   BMI 27.81 kg/m   Physical Exam Vitals and nursing note reviewed.  Constitutional:      General: He is not in acute distress.    Appearance: He is well-developed.  HENT:     Head: Normocephalic and atraumatic.  Eyes:     General:        Right eye: No discharge.        Left eye: No discharge.     Conjunctiva/sclera: Conjunctivae normal.  Cardiovascular:     Rate and Rhythm: Normal rate and regular rhythm.     Heart sounds: Normal heart sounds. No murmur heard.  No friction rub. No gallop.   Pulmonary:     Effort: Pulmonary effort is normal. No respiratory distress.     Breath sounds: Normal breath sounds.  Abdominal:     General:  There is no distension.     Palpations: Abdomen is soft.     Tenderness: There is no abdominal tenderness.  Musculoskeletal:        General: Swelling and tenderness present.     Cervical back: Neck supple.     Comments: Mild swelling of the left shoulder.  Tenderness to palpation.  Severe pain with attempted range of motion.  No midline spinal tenderness.  Abrasion to the left forearm without significant bony tenderness.  No apparent pain with range of motion of the left wrist or elbow.    Skin:    General: Skin is warm and dry.  Neurological:     Mental Status: He is alert.  Psychiatric:        Behavior: Behavior normal.        Thought Content: Thought content normal.     ED Results / Procedures / Treatments   Labs (all labs ordered are listed, but only abnormal  results are displayed) Labs Reviewed - No data to display  EKG EKG Interpretation  Date/Time:  Monday February 23 2020 11:29:13 EDT Ventricular Rate:  71 PR Interval:    QRS Duration: 85 QT Interval:  405 QTC Calculation: 441 R Axis:   32 Text Interpretation: Sinus rhythm Confirmed by Virgel Manifold [phone]) on 02/23/2020 12:01:04 PM   Radiology DG Ribs Unilateral W/Chest Left  Result Date: 02/23/2020 CLINICAL DATA:  Left rib pain after fall from ladder. EXAM: LEFT RIBS AND CHEST - 3+ VIEW COMPARISON:  None. FINDINGS: No fracture or other bone lesions are seen involving the ribs. There is no evidence of pneumothorax or pleural effusion. Both lungs are clear. Heart size and mediastinal contours are within normal limits. IMPRESSION: Negative. Electronically Signed   By: Marijo Conception M.D.   On: 02/23/2020 12:20   DG Thoracic Spine 2 View  Result Date: 02/23/2020 CLINICAL DATA:  Fall from ladder. EXAM: THORACIC SPINE 2 VIEWS COMPARISON:  None. FINDINGS: No fracture or spondylolisthesis is noted. Anterior osteophyte formation is noted at multiple levels in the lower thoracic spine. IMPRESSION: Degenerative changes as described above. No acute abnormality is noted. Electronically Signed   By: Marijo Conception M.D.   On: 02/23/2020 12:23   DG Shoulder Left  Result Date: 02/23/2020 CLINICAL DATA:  Left shoulder pain after fall from ladder. EXAM: LEFT SHOULDER - 2+ VIEW COMPARISON:  None. FINDINGS: Moderately displaced and comminuted fracture is seen involving the proximal left humeral head and neck. Visualized ribs are unremarkable. Joint spaces are unremarkable. IMPRESSION: Moderately displaced and comminuted proximal left humeral head and neck fracture. Electronically Signed   By: Marijo Conception M.D.   On: 02/23/2020 12:17    Procedures Procedures (including critical care time)  Medications Ordered in ED Medications  ibuprofen (ADVIL) tablet 600 mg (600 mg Oral Given 02/23/20 1142)    oxyCODONE-acetaminophen (PERCOCET/ROXICET) 5-325 MG per tablet 1 tablet (1 tablet Oral Given 02/23/20 1142)    ED Course  I have reviewed the triage vital signs and the nursing notes.  Pertinent labs & imaging results that were available during my care of the patient were reviewed by me and considered in my medical decision making (see chart for details).    MDM Rules/Calculators/A&P                          69 year old male with left humeral head/neck fracture after mechanical fall.  Closed injury.  Neurovascularly intact.  Sling.  As needed pain medication.  Orthopedic surgery follow-up. When getting patient up for discharge he complained of pain in L foot that he didn't report prior. TTP lateral foot and mild swelling. Subsequent imaging showing fifth metatarsal fx. Crutches would be of significant benefit with humerus fx on same side. Cam walker.    Final Clinical Impression(s) / ED Diagnoses Final diagnoses:  Closed fracture of proximal end of left humerus, unspecified fracture morphology, initial encounter  Closed fracture of base of fifth metatarsal bone of left foot, initial encounter    Rx / DC Orders ED Discharge Orders    None       Virgel Manifold, MD 02/25/20 0745

## 2020-02-23 NOTE — ED Triage Notes (Signed)
PT fell 8 feet off of a ladder, landing on left side injuring left side and left shoulder. Left arm abrasion noted. Pt is alert and oriented. Denies loss of consciousness.

## 2020-02-27 DIAGNOSIS — G4733 Obstructive sleep apnea (adult) (pediatric): Secondary | ICD-10-CM | POA: Diagnosis not present

## 2020-03-04 DIAGNOSIS — S42202A Unspecified fracture of upper end of left humerus, initial encounter for closed fracture: Secondary | ICD-10-CM | POA: Diagnosis not present

## 2020-03-04 DIAGNOSIS — S92352A Displaced fracture of fifth metatarsal bone, left foot, initial encounter for closed fracture: Secondary | ICD-10-CM | POA: Diagnosis not present

## 2020-03-05 DIAGNOSIS — S42202A Unspecified fracture of upper end of left humerus, initial encounter for closed fracture: Secondary | ICD-10-CM | POA: Diagnosis not present

## 2020-03-22 DIAGNOSIS — S92352D Displaced fracture of fifth metatarsal bone, left foot, subsequent encounter for fracture with routine healing: Secondary | ICD-10-CM | POA: Diagnosis not present

## 2020-03-22 DIAGNOSIS — M79672 Pain in left foot: Secondary | ICD-10-CM | POA: Diagnosis not present

## 2020-03-28 DIAGNOSIS — G4733 Obstructive sleep apnea (adult) (pediatric): Secondary | ICD-10-CM | POA: Diagnosis not present

## 2020-04-02 DIAGNOSIS — S42202D Unspecified fracture of upper end of left humerus, subsequent encounter for fracture with routine healing: Secondary | ICD-10-CM | POA: Diagnosis not present

## 2020-04-02 DIAGNOSIS — S92352D Displaced fracture of fifth metatarsal bone, left foot, subsequent encounter for fracture with routine healing: Secondary | ICD-10-CM | POA: Diagnosis not present

## 2020-04-15 DIAGNOSIS — S42202D Unspecified fracture of upper end of left humerus, subsequent encounter for fracture with routine healing: Secondary | ICD-10-CM | POA: Diagnosis not present

## 2020-04-15 DIAGNOSIS — M25512 Pain in left shoulder: Secondary | ICD-10-CM | POA: Diagnosis not present

## 2020-04-20 DIAGNOSIS — M25512 Pain in left shoulder: Secondary | ICD-10-CM | POA: Diagnosis not present

## 2020-04-21 DIAGNOSIS — S92352D Displaced fracture of fifth metatarsal bone, left foot, subsequent encounter for fracture with routine healing: Secondary | ICD-10-CM | POA: Diagnosis not present

## 2020-04-21 DIAGNOSIS — M79672 Pain in left foot: Secondary | ICD-10-CM | POA: Diagnosis not present

## 2020-04-23 DIAGNOSIS — M25512 Pain in left shoulder: Secondary | ICD-10-CM | POA: Diagnosis not present

## 2020-04-27 DIAGNOSIS — M25512 Pain in left shoulder: Secondary | ICD-10-CM | POA: Diagnosis not present

## 2020-04-28 DIAGNOSIS — G4733 Obstructive sleep apnea (adult) (pediatric): Secondary | ICD-10-CM | POA: Diagnosis not present

## 2020-04-30 DIAGNOSIS — M25512 Pain in left shoulder: Secondary | ICD-10-CM | POA: Diagnosis not present

## 2020-05-04 DIAGNOSIS — M25512 Pain in left shoulder: Secondary | ICD-10-CM | POA: Diagnosis not present

## 2020-05-06 DIAGNOSIS — M25512 Pain in left shoulder: Secondary | ICD-10-CM | POA: Diagnosis not present

## 2020-05-07 DIAGNOSIS — S42202D Unspecified fracture of upper end of left humerus, subsequent encounter for fracture with routine healing: Secondary | ICD-10-CM | POA: Diagnosis not present

## 2020-05-11 DIAGNOSIS — M25512 Pain in left shoulder: Secondary | ICD-10-CM | POA: Diagnosis not present

## 2020-05-17 DIAGNOSIS — M25512 Pain in left shoulder: Secondary | ICD-10-CM | POA: Diagnosis not present

## 2020-05-25 DIAGNOSIS — M25512 Pain in left shoulder: Secondary | ICD-10-CM | POA: Diagnosis not present

## 2020-05-27 DIAGNOSIS — M25512 Pain in left shoulder: Secondary | ICD-10-CM | POA: Diagnosis not present

## 2020-05-28 DIAGNOSIS — G4733 Obstructive sleep apnea (adult) (pediatric): Secondary | ICD-10-CM | POA: Diagnosis not present

## 2020-05-28 DIAGNOSIS — S92352D Displaced fracture of fifth metatarsal bone, left foot, subsequent encounter for fracture with routine healing: Secondary | ICD-10-CM | POA: Diagnosis not present

## 2020-05-31 DIAGNOSIS — M25512 Pain in left shoulder: Secondary | ICD-10-CM | POA: Diagnosis not present

## 2020-06-03 DIAGNOSIS — M25512 Pain in left shoulder: Secondary | ICD-10-CM | POA: Diagnosis not present

## 2020-06-04 DIAGNOSIS — M7502 Adhesive capsulitis of left shoulder: Secondary | ICD-10-CM | POA: Diagnosis not present

## 2020-06-04 DIAGNOSIS — S42202D Unspecified fracture of upper end of left humerus, subsequent encounter for fracture with routine healing: Secondary | ICD-10-CM | POA: Diagnosis not present

## 2020-06-07 DIAGNOSIS — M25512 Pain in left shoulder: Secondary | ICD-10-CM | POA: Diagnosis not present

## 2020-06-10 DIAGNOSIS — M25512 Pain in left shoulder: Secondary | ICD-10-CM | POA: Diagnosis not present

## 2020-06-22 DIAGNOSIS — M25512 Pain in left shoulder: Secondary | ICD-10-CM | POA: Diagnosis not present

## 2020-06-25 DIAGNOSIS — M25512 Pain in left shoulder: Secondary | ICD-10-CM | POA: Diagnosis not present

## 2020-06-28 DIAGNOSIS — G4733 Obstructive sleep apnea (adult) (pediatric): Secondary | ICD-10-CM | POA: Diagnosis not present

## 2020-06-29 DIAGNOSIS — M25512 Pain in left shoulder: Secondary | ICD-10-CM | POA: Diagnosis not present

## 2020-07-01 DIAGNOSIS — M25512 Pain in left shoulder: Secondary | ICD-10-CM | POA: Diagnosis not present

## 2020-07-02 DIAGNOSIS — S42202D Unspecified fracture of upper end of left humerus, subsequent encounter for fracture with routine healing: Secondary | ICD-10-CM | POA: Diagnosis not present

## 2020-07-02 DIAGNOSIS — M7502 Adhesive capsulitis of left shoulder: Secondary | ICD-10-CM | POA: Diagnosis not present

## 2020-07-06 DIAGNOSIS — M25512 Pain in left shoulder: Secondary | ICD-10-CM | POA: Diagnosis not present

## 2020-07-07 DIAGNOSIS — M65332 Trigger finger, left middle finger: Secondary | ICD-10-CM | POA: Diagnosis not present

## 2020-07-09 DIAGNOSIS — M25512 Pain in left shoulder: Secondary | ICD-10-CM | POA: Diagnosis not present

## 2020-07-15 DIAGNOSIS — G4733 Obstructive sleep apnea (adult) (pediatric): Secondary | ICD-10-CM | POA: Diagnosis not present

## 2020-07-26 DIAGNOSIS — Z0000 Encounter for general adult medical examination without abnormal findings: Secondary | ICD-10-CM | POA: Diagnosis not present

## 2020-07-26 DIAGNOSIS — Z6827 Body mass index (BMI) 27.0-27.9, adult: Secondary | ICD-10-CM | POA: Diagnosis not present

## 2020-07-26 DIAGNOSIS — G4733 Obstructive sleep apnea (adult) (pediatric): Secondary | ICD-10-CM | POA: Diagnosis not present

## 2020-07-26 DIAGNOSIS — K219 Gastro-esophageal reflux disease without esophagitis: Secondary | ICD-10-CM | POA: Diagnosis not present

## 2020-07-26 DIAGNOSIS — Z1389 Encounter for screening for other disorder: Secondary | ICD-10-CM | POA: Diagnosis not present

## 2020-07-26 DIAGNOSIS — I10 Essential (primary) hypertension: Secondary | ICD-10-CM | POA: Diagnosis not present

## 2020-07-26 DIAGNOSIS — E663 Overweight: Secondary | ICD-10-CM | POA: Diagnosis not present

## 2020-07-29 DIAGNOSIS — G4733 Obstructive sleep apnea (adult) (pediatric): Secondary | ICD-10-CM | POA: Diagnosis not present

## 2020-08-02 DIAGNOSIS — L570 Actinic keratosis: Secondary | ICD-10-CM | POA: Diagnosis not present

## 2020-08-02 DIAGNOSIS — Z85828 Personal history of other malignant neoplasm of skin: Secondary | ICD-10-CM | POA: Diagnosis not present

## 2020-08-02 DIAGNOSIS — L814 Other melanin hyperpigmentation: Secondary | ICD-10-CM | POA: Diagnosis not present

## 2020-08-02 DIAGNOSIS — I781 Nevus, non-neoplastic: Secondary | ICD-10-CM | POA: Diagnosis not present

## 2020-08-05 DIAGNOSIS — M65332 Trigger finger, left middle finger: Secondary | ICD-10-CM | POA: Diagnosis not present

## 2020-08-16 DIAGNOSIS — M7502 Adhesive capsulitis of left shoulder: Secondary | ICD-10-CM | POA: Diagnosis not present

## 2020-08-16 DIAGNOSIS — S42202D Unspecified fracture of upper end of left humerus, subsequent encounter for fracture with routine healing: Secondary | ICD-10-CM | POA: Diagnosis not present

## 2020-08-26 DIAGNOSIS — G4733 Obstructive sleep apnea (adult) (pediatric): Secondary | ICD-10-CM | POA: Diagnosis not present

## 2020-08-26 DIAGNOSIS — M1712 Unilateral primary osteoarthritis, left knee: Secondary | ICD-10-CM | POA: Diagnosis not present

## 2020-09-26 DIAGNOSIS — G4733 Obstructive sleep apnea (adult) (pediatric): Secondary | ICD-10-CM | POA: Diagnosis not present

## 2020-10-19 DIAGNOSIS — D3131 Benign neoplasm of right choroid: Secondary | ICD-10-CM | POA: Diagnosis not present

## 2020-10-19 DIAGNOSIS — H524 Presbyopia: Secondary | ICD-10-CM | POA: Diagnosis not present

## 2020-10-26 DIAGNOSIS — G4733 Obstructive sleep apnea (adult) (pediatric): Secondary | ICD-10-CM | POA: Diagnosis not present

## 2020-11-26 DIAGNOSIS — G4733 Obstructive sleep apnea (adult) (pediatric): Secondary | ICD-10-CM | POA: Diagnosis not present

## 2020-12-26 DIAGNOSIS — G4733 Obstructive sleep apnea (adult) (pediatric): Secondary | ICD-10-CM | POA: Diagnosis not present

## 2021-01-26 DIAGNOSIS — G4733 Obstructive sleep apnea (adult) (pediatric): Secondary | ICD-10-CM | POA: Diagnosis not present

## 2021-02-14 DIAGNOSIS — Z85828 Personal history of other malignant neoplasm of skin: Secondary | ICD-10-CM | POA: Diagnosis not present

## 2021-02-14 DIAGNOSIS — L309 Dermatitis, unspecified: Secondary | ICD-10-CM | POA: Diagnosis not present

## 2021-08-02 DIAGNOSIS — Z23 Encounter for immunization: Secondary | ICD-10-CM | POA: Diagnosis not present

## 2021-08-02 DIAGNOSIS — Z1331 Encounter for screening for depression: Secondary | ICD-10-CM | POA: Diagnosis not present

## 2021-08-02 DIAGNOSIS — I10 Essential (primary) hypertension: Secondary | ICD-10-CM | POA: Diagnosis not present

## 2021-08-02 DIAGNOSIS — E663 Overweight: Secondary | ICD-10-CM | POA: Diagnosis not present

## 2021-08-02 DIAGNOSIS — Z6826 Body mass index (BMI) 26.0-26.9, adult: Secondary | ICD-10-CM | POA: Diagnosis not present

## 2021-08-02 DIAGNOSIS — Z0001 Encounter for general adult medical examination with abnormal findings: Secondary | ICD-10-CM | POA: Diagnosis not present

## 2021-08-02 DIAGNOSIS — K219 Gastro-esophageal reflux disease without esophagitis: Secondary | ICD-10-CM | POA: Diagnosis not present

## 2021-08-02 DIAGNOSIS — E119 Type 2 diabetes mellitus without complications: Secondary | ICD-10-CM | POA: Diagnosis not present

## 2021-08-03 DIAGNOSIS — Z23 Encounter for immunization: Secondary | ICD-10-CM | POA: Diagnosis not present

## 2021-08-15 DIAGNOSIS — Z85828 Personal history of other malignant neoplasm of skin: Secondary | ICD-10-CM | POA: Diagnosis not present

## 2021-08-15 DIAGNOSIS — L570 Actinic keratosis: Secondary | ICD-10-CM | POA: Diagnosis not present

## 2021-08-15 DIAGNOSIS — Z1283 Encounter for screening for malignant neoplasm of skin: Secondary | ICD-10-CM | POA: Diagnosis not present

## 2022-02-15 DIAGNOSIS — L570 Actinic keratosis: Secondary | ICD-10-CM | POA: Diagnosis not present

## 2022-02-15 DIAGNOSIS — Z1283 Encounter for screening for malignant neoplasm of skin: Secondary | ICD-10-CM | POA: Diagnosis not present

## 2022-02-15 DIAGNOSIS — Z85828 Personal history of other malignant neoplasm of skin: Secondary | ICD-10-CM | POA: Diagnosis not present

## 2022-02-15 DIAGNOSIS — D239 Other benign neoplasm of skin, unspecified: Secondary | ICD-10-CM | POA: Diagnosis not present

## 2022-04-12 DIAGNOSIS — Z23 Encounter for immunization: Secondary | ICD-10-CM | POA: Diagnosis not present

## 2022-07-31 DIAGNOSIS — M545 Low back pain, unspecified: Secondary | ICD-10-CM | POA: Diagnosis not present

## 2022-08-04 DIAGNOSIS — Z1331 Encounter for screening for depression: Secondary | ICD-10-CM | POA: Diagnosis not present

## 2022-08-04 DIAGNOSIS — D518 Other vitamin B12 deficiency anemias: Secondary | ICD-10-CM | POA: Diagnosis not present

## 2022-08-04 DIAGNOSIS — E663 Overweight: Secondary | ICD-10-CM | POA: Diagnosis not present

## 2022-08-04 DIAGNOSIS — Z6828 Body mass index (BMI) 28.0-28.9, adult: Secondary | ICD-10-CM | POA: Diagnosis not present

## 2022-08-04 DIAGNOSIS — Z0001 Encounter for general adult medical examination with abnormal findings: Secondary | ICD-10-CM | POA: Diagnosis not present

## 2022-08-04 DIAGNOSIS — I10 Essential (primary) hypertension: Secondary | ICD-10-CM | POA: Diagnosis not present

## 2022-08-04 DIAGNOSIS — E559 Vitamin D deficiency, unspecified: Secondary | ICD-10-CM | POA: Diagnosis not present

## 2022-08-04 DIAGNOSIS — E039 Hypothyroidism, unspecified: Secondary | ICD-10-CM | POA: Diagnosis not present

## 2022-08-04 DIAGNOSIS — E119 Type 2 diabetes mellitus without complications: Secondary | ICD-10-CM | POA: Diagnosis not present

## 2022-08-04 DIAGNOSIS — Z125 Encounter for screening for malignant neoplasm of prostate: Secondary | ICD-10-CM | POA: Diagnosis not present

## 2022-08-10 IMAGING — DX DG SHOULDER 2+V*L*
3 series · 3 of 3 positions shown · non-contrast
Comparison: None.

CLINICAL DATA: Left shoulder pain after fall from ladder.

EXAM:
LEFT SHOULDER - 2+ VIEW

[shoulder grashey]
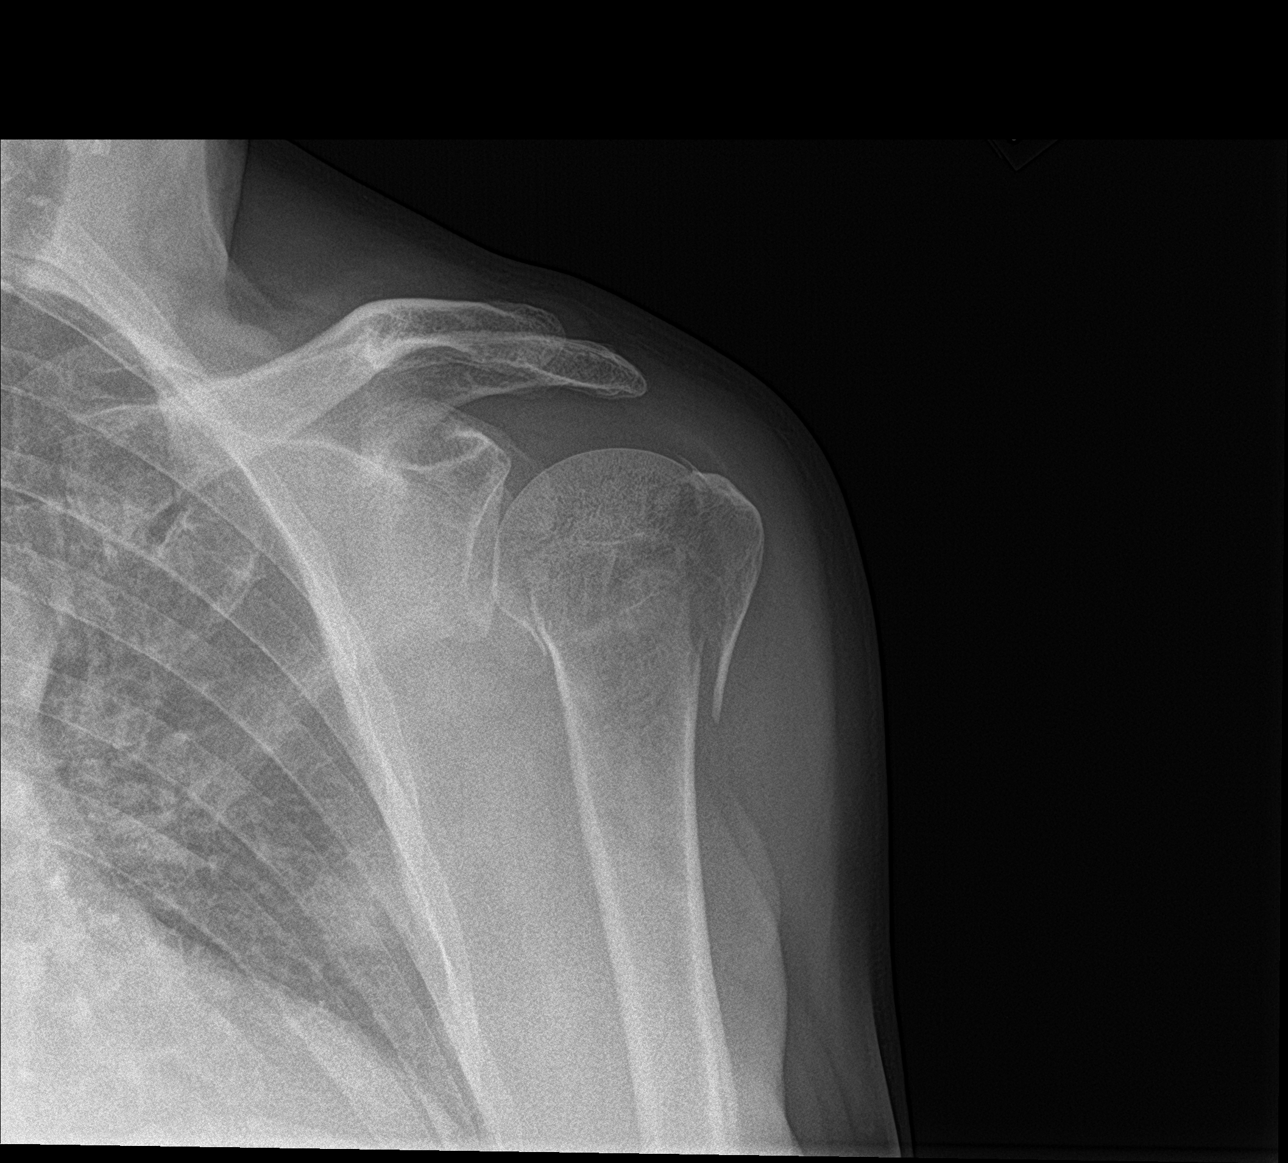

[shoulder y view]
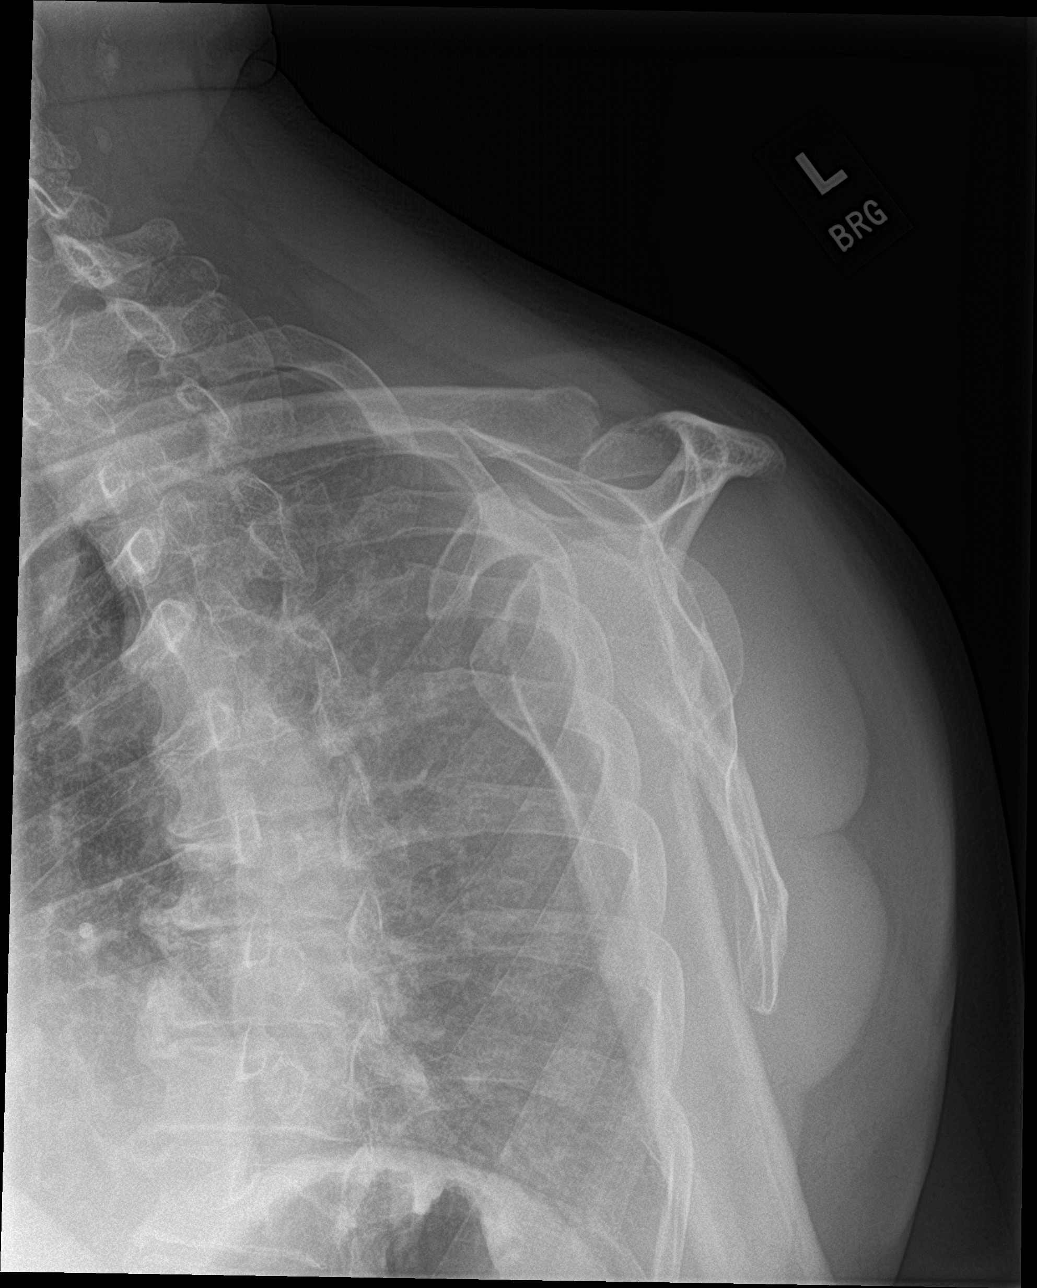

[shoulder axillary]
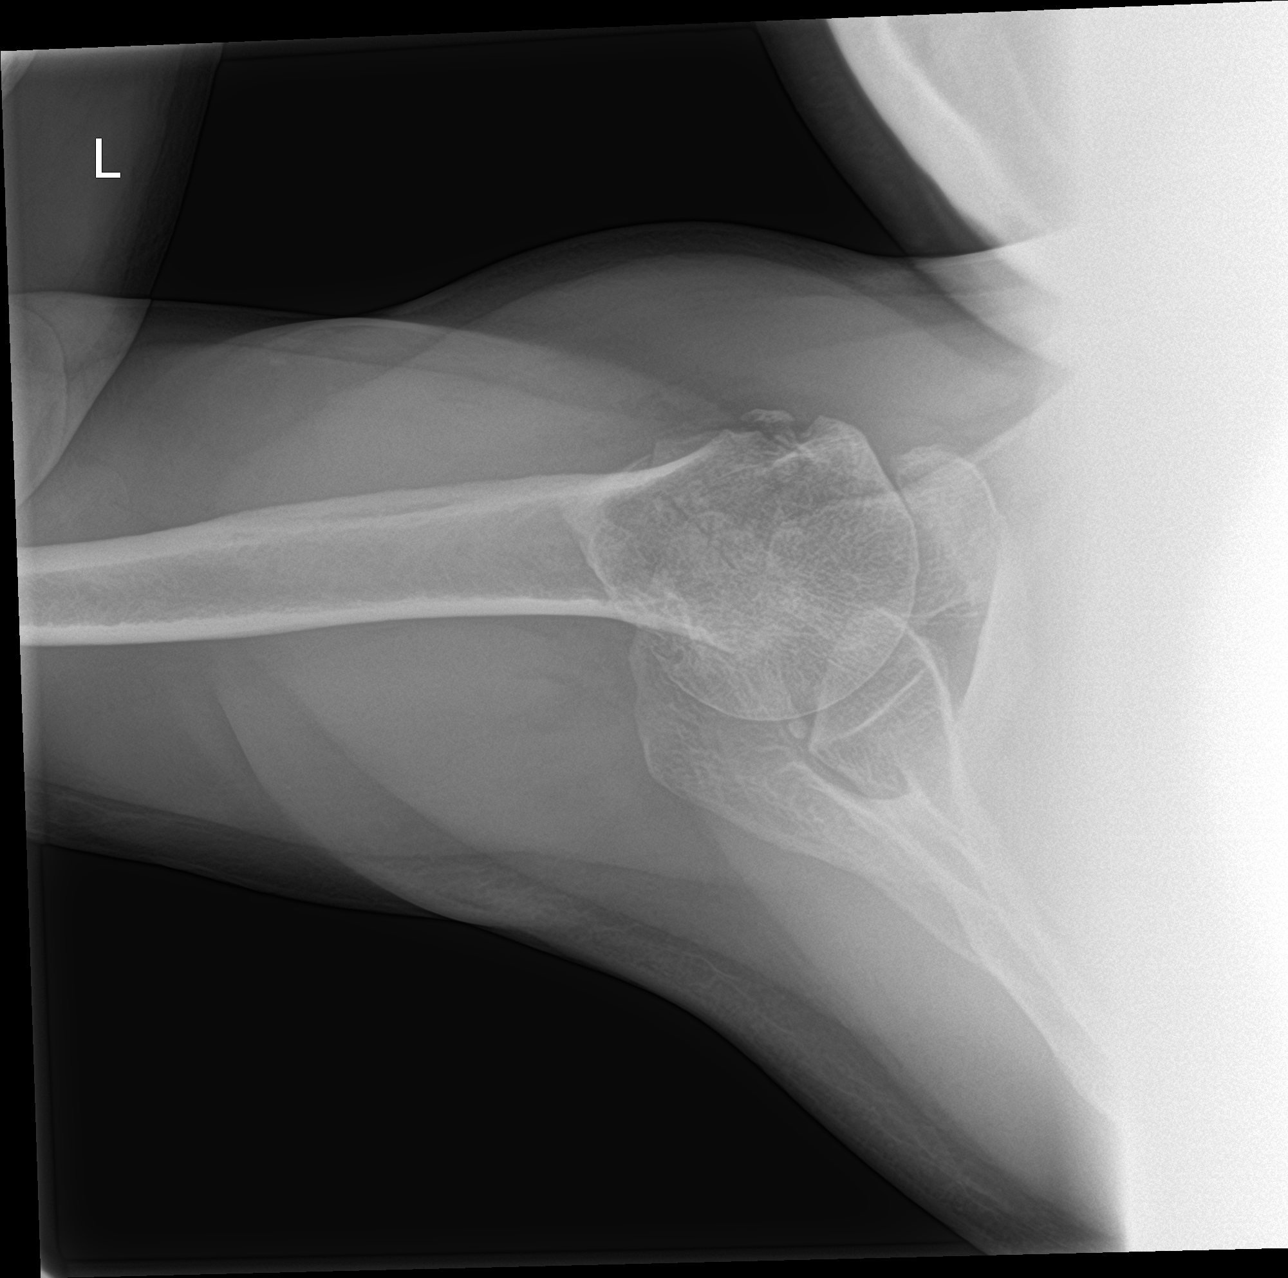

[3 of 3 positions shown; findings below may reference images not displayed]

FINDINGS: Moderately displaced and comminuted fracture is seen involving the
proximal left humeral head and neck. Visualized ribs are
unremarkable. Joint spaces are unremarkable.
IMPRESSION: Moderately displaced and comminuted proximal left humeral head and
neck fracture.

## 2022-08-10 IMAGING — DX DG RIBS W/ CHEST 3+V*L*
5 series · 5 of 5 positions shown · non-contrast
Comparison: None.

CLINICAL DATA: Left rib pain after fall from ladder.

EXAM:
LEFT RIBS AND CHEST - 3+ VIEW

[chest pa]
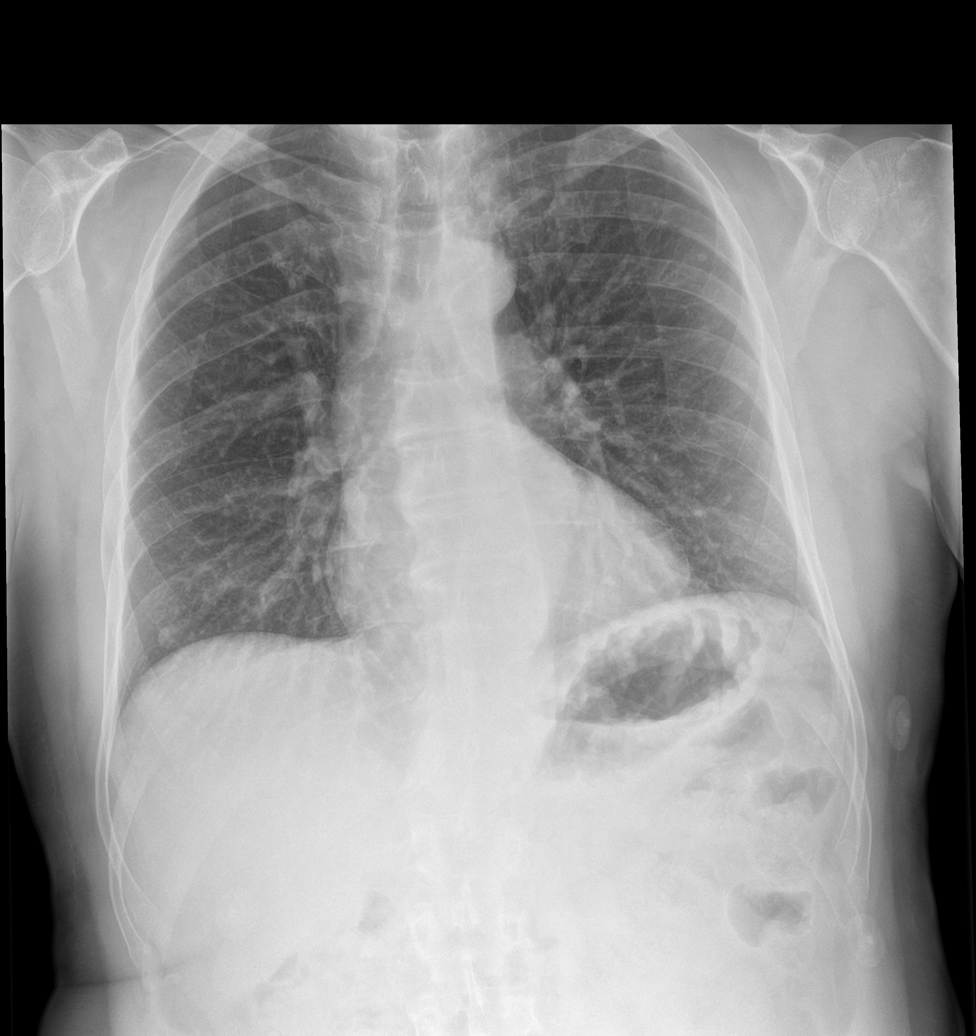

[rib pa obl (1 of 2)]
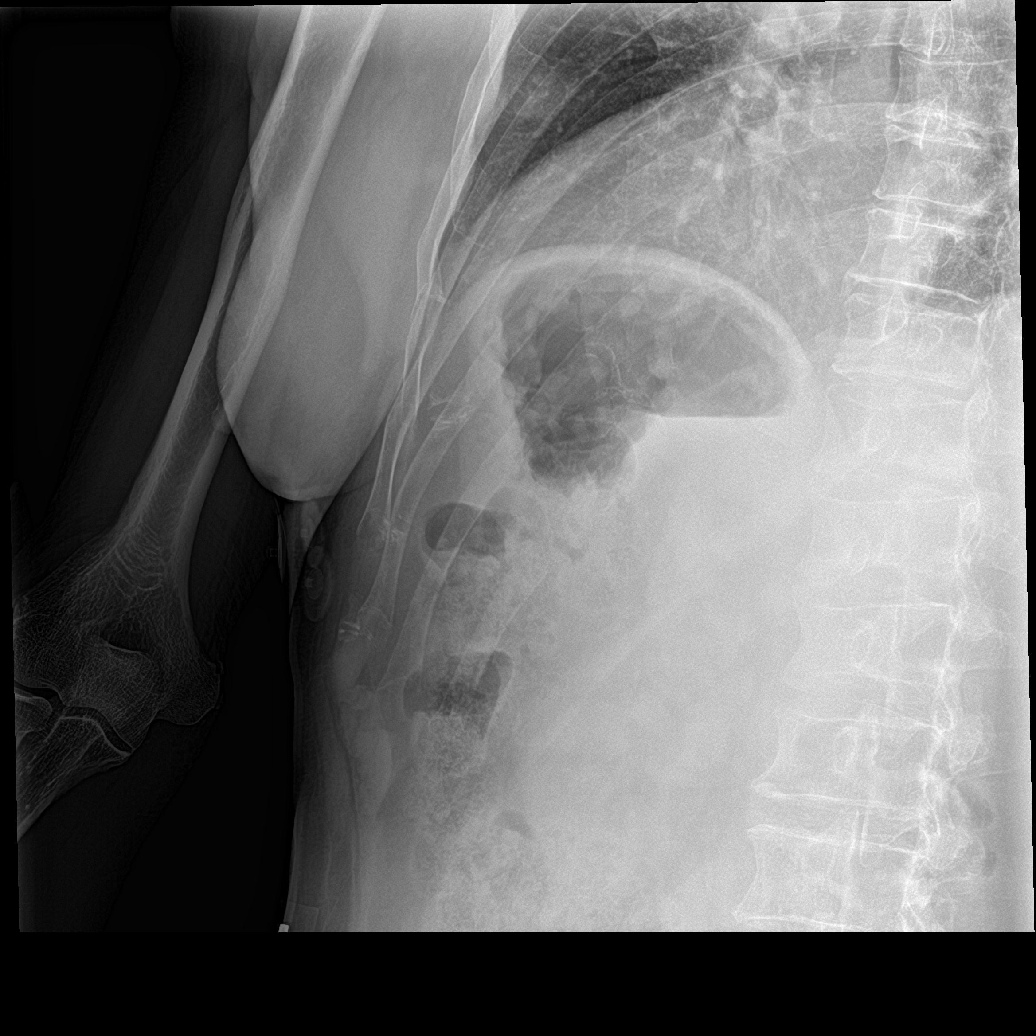

[rib pa obl (2 of 2)]
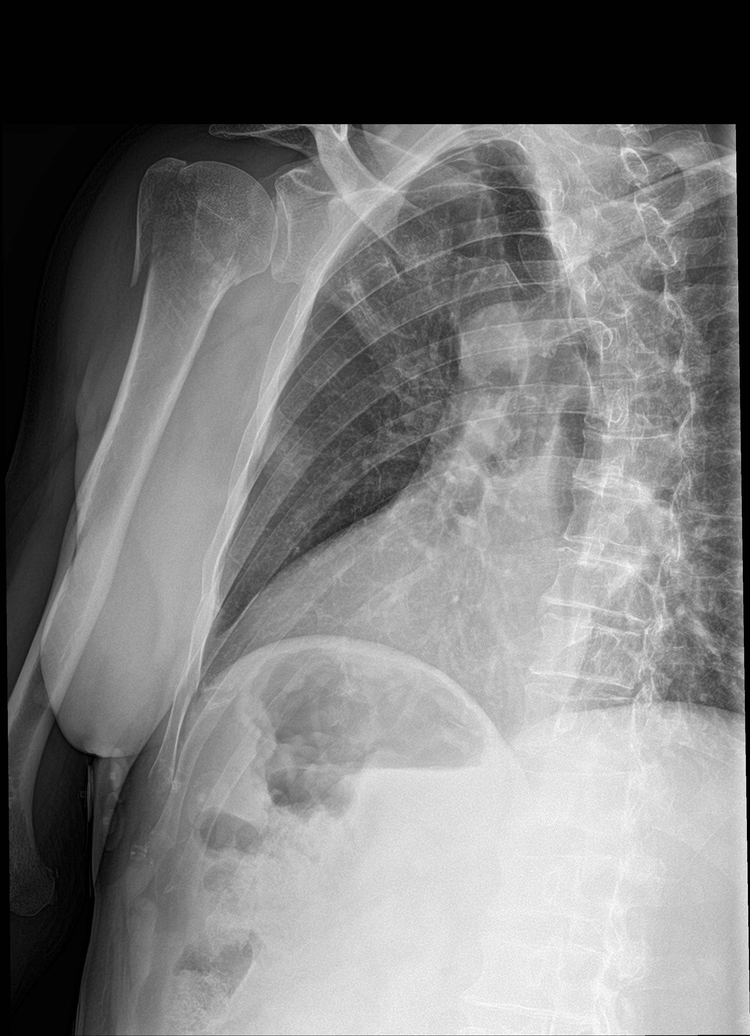

[rib pa (1 of 2)]
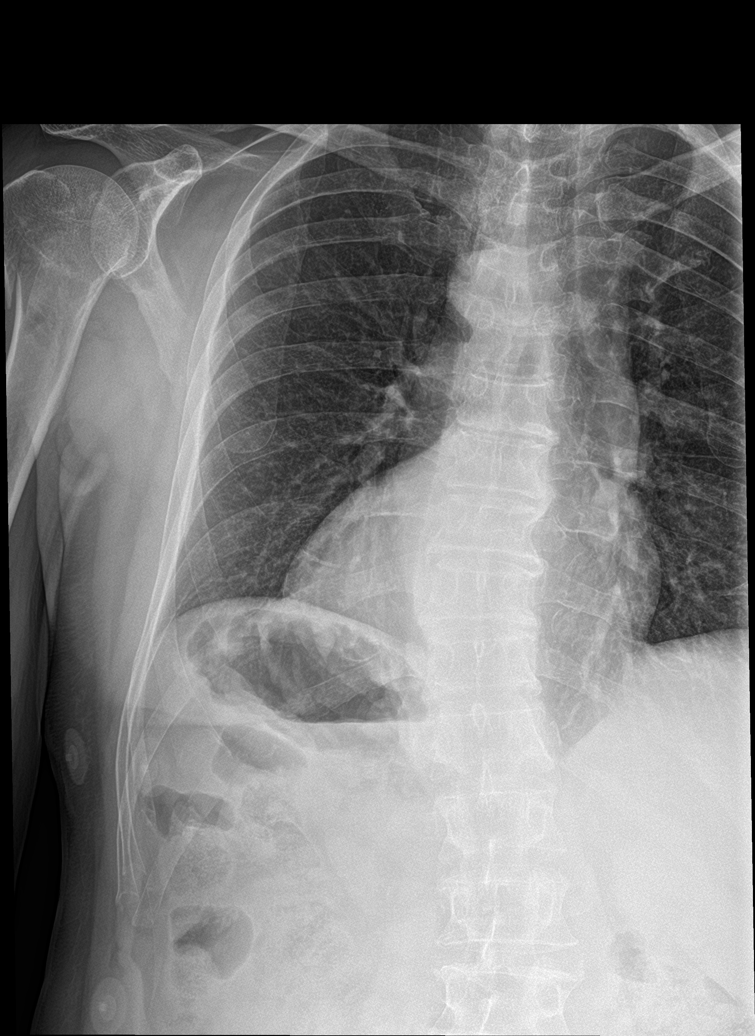

[rib pa (2 of 2)]
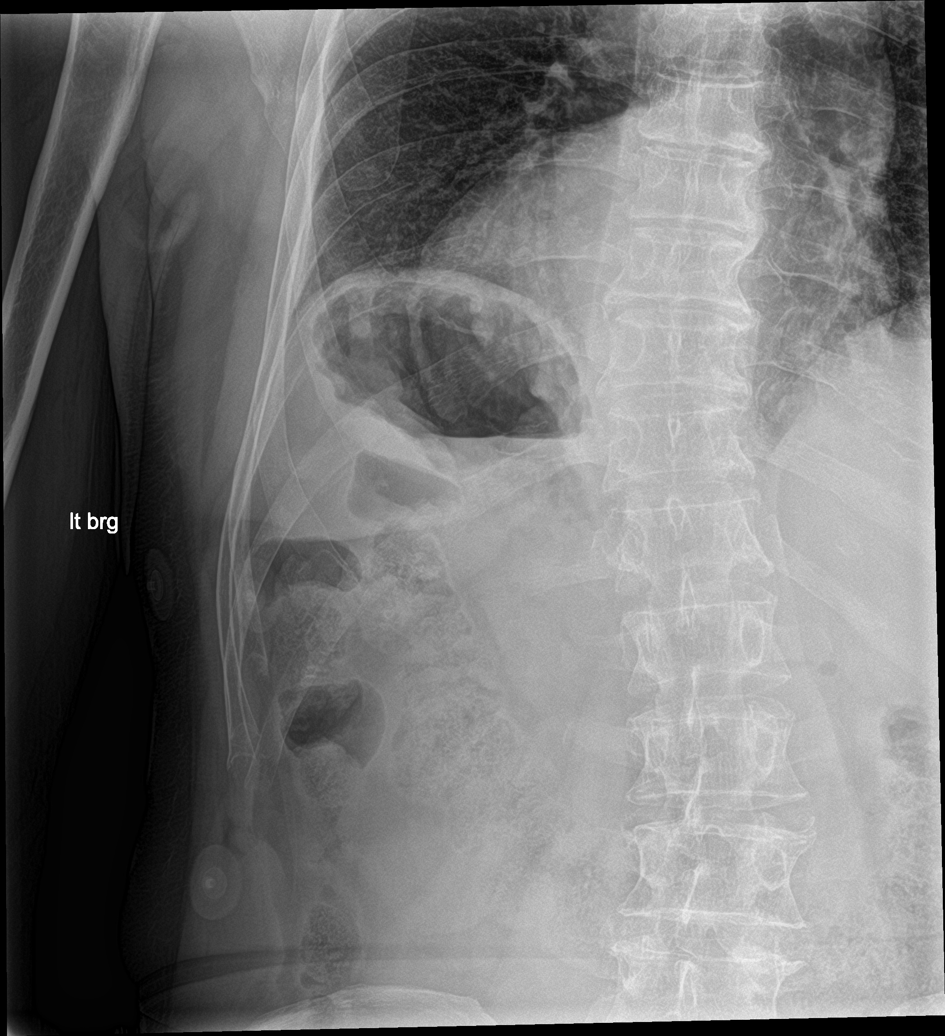

[5 of 5 positions shown; findings below may reference images not displayed]

FINDINGS: No fracture or other bone lesions are seen involving the ribs. There
is no evidence of pneumothorax or pleural effusion. Both lungs are
clear. Heart size and mediastinal contours are within normal limits.
IMPRESSION: Negative.

## 2022-08-16 DIAGNOSIS — M5451 Vertebrogenic low back pain: Secondary | ICD-10-CM | POA: Diagnosis not present

## 2022-08-30 DIAGNOSIS — M545 Low back pain, unspecified: Secondary | ICD-10-CM | POA: Diagnosis not present

## 2023-02-20 DIAGNOSIS — Z1283 Encounter for screening for malignant neoplasm of skin: Secondary | ICD-10-CM | POA: Diagnosis not present

## 2023-02-20 DIAGNOSIS — Z85828 Personal history of other malignant neoplasm of skin: Secondary | ICD-10-CM | POA: Diagnosis not present

## 2023-02-20 DIAGNOSIS — D485 Neoplasm of uncertain behavior of skin: Secondary | ICD-10-CM | POA: Diagnosis not present

## 2023-02-20 DIAGNOSIS — L570 Actinic keratosis: Secondary | ICD-10-CM | POA: Diagnosis not present

## 2023-03-02 DIAGNOSIS — H524 Presbyopia: Secondary | ICD-10-CM | POA: Diagnosis not present

## 2023-03-02 DIAGNOSIS — H47093 Other disorders of optic nerve, not elsewhere classified, bilateral: Secondary | ICD-10-CM | POA: Diagnosis not present

## 2023-05-14 DIAGNOSIS — M542 Cervicalgia: Secondary | ICD-10-CM | POA: Diagnosis not present

## 2023-05-14 DIAGNOSIS — M79601 Pain in right arm: Secondary | ICD-10-CM | POA: Diagnosis not present

## 2023-05-14 DIAGNOSIS — M25511 Pain in right shoulder: Secondary | ICD-10-CM | POA: Diagnosis not present

## 2023-05-14 DIAGNOSIS — Z01818 Encounter for other preprocedural examination: Secondary | ICD-10-CM | POA: Diagnosis not present

## 2023-06-05 DIAGNOSIS — M542 Cervicalgia: Secondary | ICD-10-CM | POA: Diagnosis not present

## 2023-07-03 DIAGNOSIS — M5412 Radiculopathy, cervical region: Secondary | ICD-10-CM | POA: Diagnosis not present

## 2023-07-11 DIAGNOSIS — M5412 Radiculopathy, cervical region: Secondary | ICD-10-CM | POA: Diagnosis not present

## 2023-07-12 DIAGNOSIS — M5412 Radiculopathy, cervical region: Secondary | ICD-10-CM | POA: Diagnosis not present

## 2023-07-17 DIAGNOSIS — M5412 Radiculopathy, cervical region: Secondary | ICD-10-CM | POA: Diagnosis not present

## 2023-07-20 DIAGNOSIS — M5412 Radiculopathy, cervical region: Secondary | ICD-10-CM | POA: Diagnosis not present

## 2023-07-23 DIAGNOSIS — M5412 Radiculopathy, cervical region: Secondary | ICD-10-CM | POA: Diagnosis not present

## 2023-07-24 DIAGNOSIS — M5412 Radiculopathy, cervical region: Secondary | ICD-10-CM | POA: Diagnosis not present

## 2023-07-31 DIAGNOSIS — M5412 Radiculopathy, cervical region: Secondary | ICD-10-CM | POA: Diagnosis not present

## 2023-08-03 DIAGNOSIS — M5412 Radiculopathy, cervical region: Secondary | ICD-10-CM | POA: Diagnosis not present

## 2023-08-06 DIAGNOSIS — Z0000 Encounter for general adult medical examination without abnormal findings: Secondary | ICD-10-CM | POA: Diagnosis not present

## 2023-08-06 DIAGNOSIS — E039 Hypothyroidism, unspecified: Secondary | ICD-10-CM | POA: Diagnosis not present

## 2023-08-06 DIAGNOSIS — E559 Vitamin D deficiency, unspecified: Secondary | ICD-10-CM | POA: Diagnosis not present

## 2023-08-06 DIAGNOSIS — Z1331 Encounter for screening for depression: Secondary | ICD-10-CM | POA: Diagnosis not present

## 2023-08-06 DIAGNOSIS — D518 Other vitamin B12 deficiency anemias: Secondary | ICD-10-CM | POA: Diagnosis not present

## 2023-08-06 DIAGNOSIS — Z6828 Body mass index (BMI) 28.0-28.9, adult: Secondary | ICD-10-CM | POA: Diagnosis not present

## 2023-08-06 DIAGNOSIS — Z125 Encounter for screening for malignant neoplasm of prostate: Secondary | ICD-10-CM | POA: Diagnosis not present

## 2023-08-06 DIAGNOSIS — E6609 Other obesity due to excess calories: Secondary | ICD-10-CM | POA: Diagnosis not present

## 2023-08-06 DIAGNOSIS — I10 Essential (primary) hypertension: Secondary | ICD-10-CM | POA: Diagnosis not present

## 2023-08-06 DIAGNOSIS — G4733 Obstructive sleep apnea (adult) (pediatric): Secondary | ICD-10-CM | POA: Diagnosis not present

## 2023-08-06 DIAGNOSIS — R7309 Other abnormal glucose: Secondary | ICD-10-CM | POA: Diagnosis not present

## 2023-08-06 DIAGNOSIS — Z0001 Encounter for general adult medical examination with abnormal findings: Secondary | ICD-10-CM | POA: Diagnosis not present

## 2023-08-06 DIAGNOSIS — K219 Gastro-esophageal reflux disease without esophagitis: Secondary | ICD-10-CM | POA: Diagnosis not present

## 2023-08-08 DIAGNOSIS — M5412 Radiculopathy, cervical region: Secondary | ICD-10-CM | POA: Diagnosis not present

## 2023-08-10 DIAGNOSIS — M5412 Radiculopathy, cervical region: Secondary | ICD-10-CM | POA: Diagnosis not present

## 2024-01-21 ENCOUNTER — Emergency Department (HOSPITAL_COMMUNITY)

## 2024-01-21 ENCOUNTER — Emergency Department (HOSPITAL_COMMUNITY)
Admission: EM | Admit: 2024-01-21 | Discharge: 2024-01-21 | Disposition: A | Discharge status: Home / Self Care | Attending: Emergency Medicine | Admitting: Emergency Medicine

## 2024-01-21 ENCOUNTER — Encounter (HOSPITAL_COMMUNITY): Payer: Self-pay | Admitting: *Deleted

## 2024-01-21 ENCOUNTER — Other Ambulatory Visit: Payer: Self-pay

## 2024-01-21 DIAGNOSIS — W298XXA Contact with other powered powered hand tools and household machinery, initial encounter: Secondary | ICD-10-CM | POA: Insufficient documentation

## 2024-01-21 DIAGNOSIS — S6991XA Unspecified injury of right wrist, hand and finger(s), initial encounter: Secondary | ICD-10-CM | POA: Diagnosis present

## 2024-01-21 DIAGNOSIS — M19041 Primary osteoarthritis, right hand: Secondary | ICD-10-CM | POA: Diagnosis not present

## 2024-01-21 DIAGNOSIS — S61210A Laceration without foreign body of right index finger without damage to nail, initial encounter: Secondary | ICD-10-CM | POA: Diagnosis not present

## 2024-01-21 DIAGNOSIS — Z23 Encounter for immunization: Secondary | ICD-10-CM | POA: Insufficient documentation

## 2024-01-21 DIAGNOSIS — S61411A Laceration without foreign body of right hand, initial encounter: Secondary | ICD-10-CM | POA: Diagnosis not present

## 2024-01-21 MED ORDER — IBUPROFEN 600 MG PO TABS: 600.0000 mg | ORAL_TABLET | Freq: Four times a day (QID) | ORAL | 0 refills | Status: DC | PRN

## 2024-01-21 MED ORDER — CEPHALEXIN 500 MG PO CAPS: 500.0000 mg | ORAL_CAPSULE | Freq: Four times a day (QID) | ORAL | 0 refills | Status: AC

## 2024-01-21 MED ORDER — LIDOCAINE HCL (PF) 1 % IJ SOLN
5.0000 mL | Freq: Once | INTRAMUSCULAR | Status: AC
  Administered 2024-01-21: 5 mL
  Filled 2024-01-21: qty 5

## 2024-01-21 MED ORDER — CEPHALEXIN 500 MG PO CAPS
500.0000 mg | ORAL_CAPSULE | Freq: Once | ORAL | Status: AC
  Administered 2024-01-21: 500 mg via ORAL
  Filled 2024-01-21: qty 1

## 2024-01-21 MED ORDER — TETANUS-DIPHTH-ACELL PERTUSSIS 5-2.5-18.5 LF-MCG/0.5 IM SUSY
0.5000 mL | PREFILLED_SYRINGE | Freq: Once | INTRAMUSCULAR | Status: AC
  Administered 2024-01-21: 0.5 mL via INTRAMUSCULAR
  Filled 2024-01-21: qty 0.5

## 2024-01-21 MED ORDER — BACITRACIN ZINC 500 UNIT/GM EX OINT
TOPICAL_OINTMENT | Freq: Once | CUTANEOUS | Status: AC
  Administered 2024-01-21: 1 via TOPICAL
  Filled 2024-01-21: qty 1.8

## 2024-01-21 NOTE — ED Notes (Signed)
..  The patient is A&OX4, ambulatory at d/c with independent steady gait, NAD. Pt verbalized understanding of d/c instructions, prescriptions and follow up care.

## 2024-01-21 NOTE — ED Provider Notes (Signed)
 [facility] EMERGENCY DEPARTMENT AT [facility] Provider Note   CSN: [id number] Arrival date & time: 01/21/24  1459     Patient presents with: Laceration   Derek Ali is a 73 y.o. male.   Patient to ED with right index finger laceration after accidentally putting his finger into a pea sheller, a grinder like hand machine. He reports laceration across the pad of the finger distally. No other injury.  The history is provided by the patient. No language interpreter was used.  Laceration      Prior to Admission medications   Medication Sig Start Date End Date Taking? Authorizing Provider  cephALEXin  (KEFLEX ) 500 MG capsule Take 1 capsule (500 mg total) by mouth 4 (four) times daily. 01/21/24  Yes Anona Giovannini, Margit, PA-C  ibuprofen  (ADVIL ) 600 MG tablet Take 1 tablet (600 mg total) by mouth every 6 (six) hours as needed. 01/21/24  Yes Vola Beneke, PA-C  ALTACE 5 MG capsule Take 5 mg by mouth at bedtime. 11/08/18   [provider]  esomeprazole (NEXIUM) 20 MG capsule Take 20 mg by mouth daily with breakfast.     [provider]  hypromellose (GENTEAL) 0.3 % GEL ophthalmic ointment Place into the left eye every 3 (three) hours as needed. Patient not taking: Reported on 02/23/2020 06/01/19   Wurst, [place], PA-C  Omega-3 Fatty Acids (FISH OIL) 1000 MG CAPS Take 1 capsule by mouth daily.    [provider]  oxyCODONE -acetaminophen  (PERCOCET/ROXICET) 5-325 MG tablet Take 1-2 tablets by mouth every 6 (six) hours as needed for severe pain. 02/23/20   Loetta Senior, MD    Allergies: Patient has no known allergies.    Review of Systems  Updated Vital Signs Ht 5' 4 (1.626 m)   Wt 71.7 kg   BMI 27.12 kg/m   Physical Exam Vitals and nursing note reviewed.  Constitutional:      Appearance: Normal appearance. He is well-developed.  Pulmonary:     Effort: Pulmonary effort is normal.  Musculoskeletal:        General: Normal range of motion.     Cervical  back: Normal range of motion.  Skin:    General: Skin is warm and dry.     Comments: Deep flap laceration across distal palmar right index finger, following the DIP joint line. No bone visualized. Nail intact.   Neurological:     Mental Status: He is alert and oriented to person, place, and time.     (all labs ordered are listed, but only abnormal results are displayed) Labs Reviewed - No data to display  EKG: None  Radiology: DG Finger Index Right Result Date: 01/21/2024 CLINICAL DATA:  Finger injury. EXAM: RIGHT INDEX FINGER 2+V COMPARISON:  None Available. FINDINGS: Small soft tissue laceration is seen along the palm are aspect of the second distal phalanx. No radiopaque foreign body or underlying osseous abnormality. Degenerative changes in the distal interphalangeal joint. IMPRESSION: Soft tissue injury without radiopaque foreign body or fracture. Electronically Signed   By: Newell Eke M.D.   On: 01/21/2024 16:37     .Laceration Repair  Date/Time: 01/21/2024 4:09 PM  Performed by: Odell Margit, PA-C Authorized by: Odell Margit, PA-C   Consent:    Consent obtained:  Verbal Universal protocol:    Procedure explained and questions answered to patient or proxy's satisfaction: yes     Patient identity confirmed:  Verbally with patient Anesthesia:    Anesthesia method:  Local infiltration   Local anesthetic:  Lidocaine  1% w/o epi Laceration details:    Location:  Finger   Finger location:  R index finger   Length (cm):  2.5 Pre-procedure details:    Preparation:  Imaging obtained to evaluate for foreign bodies Treatment:    Area cleansed with:  Saline   Amount of cleaning:  Standard   Irrigation method:  Syringe   Debridement:  Minimal Skin repair:    Repair method:  Sutures   Suture size:  4-0   Suture material:  Prolene   Number of sutures:  4 Approximation:    Approximation:  Close Repair type:    Repair type:  Simple Post-procedure details:     Procedure completion:  Tolerated well, no immediate complications    Medications Ordered in the ED  lidocaine  (PF) (XYLOCAINE ) 1 % injection 5 mL (has no administration in time range)  Tdap (BOOSTRIX ) injection 0.5 mL (has no administration in time range)  cephALEXin  (KEFLEX ) capsule 500 mg (has no administration in time range)    Clinical Course as of 01/21/24 1653  Mon Jan 21, 2024  1652 Deep flap laceration distal pad of right index finger. Imaging negative for FB or fx. Wound cleaned and repaired per procedure note. Will cover with Keflex  and give care instructions. Tetanus updated.  [SU]    Clinical Course User Index [SU] Odell Balls, PA-C                                 Medical Decision Making Amount and/or Complexity of Data Reviewed Radiology: ordered.  Risk Prescription drug management.        Final diagnoses:  Laceration of right index finger without foreign body without damage to nail, initial encounter    ED Discharge Orders          Ordered    cephALEXin  (KEFLEX ) 500 MG capsule  4 times daily        01/21/24 1650    ibuprofen  (ADVIL ) 600 MG tablet  Every 6 hours PRN        01/21/24 1650               Odell Balls, PA-C 01/21/24 1653    Yolande Lamar BROCKS, MD 01/23/24 [phone]

## 2024-01-21 NOTE — ED Triage Notes (Signed)
 Pt lac to right index finger, states his finger got caught in pea sheller. Last tetanus shot 2016

## 2024-01-21 NOTE — Discharge Instructions (Addendum)
 Sutures will need to be removed in 10 days. Go for recheck sooner with any sign of infection - increasing pain/redness/swelling; any drainage from the wound, fever. Take the antibiotic as prescribed. Take ibuprofen  for pain as directed.

## 2024-01-31 ENCOUNTER — Other Ambulatory Visit: Payer: Self-pay

## 2024-01-31 ENCOUNTER — Ambulatory Visit
Admission: RE | Admit: 2024-01-31 | Discharge: 2024-01-31 | Disposition: A | Discharge status: Home / Self Care | Source: Ambulatory Visit | Attending: Family Medicine

## 2024-01-31 VITALS — BP 146/92 | HR 79 | Temp 98.2°F | Resp 18

## 2024-01-31 DIAGNOSIS — Z4802 Encounter for removal of sutures: Secondary | ICD-10-CM

## 2024-01-31 DIAGNOSIS — S61210A Laceration without foreign body of right index finger without damage to nail, initial encounter: Secondary | ICD-10-CM

## 2024-01-31 MED ORDER — DOXYCYCLINE HYCLATE 100 MG PO CAPS: 100.0000 mg | ORAL_CAPSULE | Freq: Two times a day (BID) | ORAL | 0 refills | Status: AC

## 2024-01-31 NOTE — ED Triage Notes (Signed)
 Pt reports had sutures placed to right index finger x10 days ago. Pt denies any pain or drainage from site.

## 2024-01-31 NOTE — Discharge Instructions (Signed)
 Clean the area at least once a day with a good wound solution and apply an antibiotic ointment and nonstick dressing.  Elevate at rest to help with swelling.  Take the full course of antibiotics and follow-up in the next 2 days or so if not significantly improving.

## 2024-01-31 NOTE — ED Provider Notes (Signed)
 [facility]    CSN: [id number] Arrival date & time: 01/31/24  1438      History   Chief Complaint Chief Complaint  Patient presents with   Suture / Staple Removal    HPI Derek Ali is a 73 y.o. male.   Patient presenting today for evaluation for suture removal.  Had a flap laceration to the distal right index finger for which he was seen in the emergency department 10 days ago.  4 sutures were placed to the area and he was placed on Keflex .  Has been cleaning the area once a day, applying a nonstick dressing and took the full course of antibiotics.  He states it is still throbbing at times and seems much more swollen the past few days.  Denies complete loss of range of motion, fever, chills, body aches but does have some numbness and tingling.    Past Medical History:  Diagnosis Date   GERD (gastroesophageal reflux disease)    Hypertension     Patient Active Problem List   Diagnosis Date Noted   Special screening for malignant neoplasms, colon 07/24/2018    Past Surgical History:  Procedure Laterality Date   COLONOSCOPY N/A 12/05/2018   Procedure: COLONOSCOPY;  Surgeon: Golda Claudis PENNER, MD;  Location: [facility];  Service: Endoscopy;  Laterality: N/A;  830   COLONOSCOPY, ESOPHAGOGASTRODUODENOSCOPY (EGD) AND ESOPHAGEAL DILATION         Home Medications    Prior to Admission medications   Medication Sig Start Date End Date Taking? Authorizing Provider  doxycycline  (VIBRAMYCIN ) 100 MG capsule Take 1 capsule (100 mg total) by mouth 2 (two) times daily. 01/31/24  Yes Stuart Vernell Norris, PA-C  ALTACE 5 MG capsule Take 5 mg by mouth at bedtime. 11/08/18   [provider]  cephALEXin  (KEFLEX ) 500 MG capsule Take 1 capsule (500 mg total) by mouth 4 (four) times daily. 01/21/24   Odell Balls, PA-C  esomeprazole (NEXIUM) 20 MG capsule Take 20 mg by mouth daily with breakfast.     [provider]  hypromellose (GENTEAL) 0.3 % GEL  ophthalmic ointment Place into the left eye every 3 (three) hours as needed. Patient not taking: Reported on 02/23/2020 06/01/19   Wurst, [place], PA-C  ibuprofen  (ADVIL ) 600 MG tablet Take 1 tablet (600 mg total) by mouth every 6 (six) hours as needed. 01/21/24   Odell Balls, PA-C  Omega-3 Fatty Acids (FISH OIL) 1000 MG CAPS Take 1 capsule by mouth daily.    [provider]  oxyCODONE -acetaminophen  (PERCOCET/ROXICET) 5-325 MG tablet Take 1-2 tablets by mouth every 6 (six) hours as needed for severe pain. 02/23/20   Loetta Senior, MD    Family History Family History  Problem Relation Age of Onset   Diabetes Mother    Healthy Father     Social History Social History   Tobacco Use   Smoking status: Never   Smokeless tobacco: Never  Substance Use Topics   Alcohol use: Never   Drug use: Never     Allergies   Patient has no known allergies.   Review of Systems Review of Systems Per HPI  Physical Exam Triage Vital Signs ED Triage Vitals  Encounter Vitals Group     BP 01/31/24 1448 (!) 146/92     Girls Systolic BP Percentile --      Girls Diastolic BP Percentile --      Boys Systolic BP Percentile --      Boys Diastolic BP  Percentile --      Pulse Rate 01/31/24 1448 79     Resp 01/31/24 1448 18     Temp 01/31/24 1448 98.2 F (36.8 C)     Temp Source 01/31/24 1448 Oral     SpO2 01/31/24 1448 97 %     Weight --      Height --      Head Circumference --      Peak Flow --      Pain Score 01/31/24 1449 0     Pain Loc --      Pain Education --      Exclude from Growth Chart --    No data found.  Updated Vital Signs BP (!) 146/92 (BP Location: Right Arm)   Pulse 79   Temp 98.2 F (36.8 C) (Oral)   Resp 18   SpO2 97%   Visual Acuity Right Eye Distance:   Left Eye Distance:   Bilateral Distance:    Right Eye Near:   Left Eye Near:    Bilateral Near:     Physical Exam Vitals and nursing note reviewed.  Constitutional:      Appearance: Normal  appearance.  HENT:     Head: Atraumatic.  Eyes:     Extraocular Movements: Extraocular movements intact.     Conjunctiva/sclera: Conjunctivae normal.  Cardiovascular:     Rate and Rhythm: Normal rate and regular rhythm.     Comments: Slightly sluggish cap refill to the right distal index finger, likely secondary to extent of edema Pulmonary:     Effort: Pulmonary effort is normal.     Breath sounds: Normal breath sounds.  Musculoskeletal:        General: Swelling and tenderness present. Normal range of motion.     Cervical back: Normal range of motion and neck supple.  Skin:    General: Skin is warm.     Comments: Distal right index finger significantly edematous, much more so than in pictures that patient has of the initial injury where the laceration appeared to be a small flap injury to this area with minimal edema.  Macerated wound edges, poor wound healing, but sutures becoming embedded and needing to be removed  Neurological:     Mental Status: He is oriented to person, place, and time.  Psychiatric:        Mood and Affect: Mood normal.        Thought Content: Thought content normal.        Judgment: Judgment normal.      UC Treatments / Results  Labs (all labs ordered are listed, but only abnormal results are displayed) Labs Reviewed - No data to display  EKG   Radiology No results found.  Procedures Procedures (including critical care time)  Medications Ordered in UC Medications - No data to display  Initial Impression / Assessment and Plan / UC Course  I have reviewed the triage vital signs and the nursing notes.  Pertinent labs & imaging results that were available during my care of the patient were reviewed by me and considered in my medical decision making (see chart for details).     Suspect worsening edema, poor wound healing secondary to inflammatory response from suture materials.  Sutures were removed today without complication and will place on a  course of doxycycline , continue diligent home wound care and dressings until fully healed.  Elevate at rest to help with swelling.  Discussed at length with patient to return in 2  days if not significantly improving, sooner if worsening in any time.  Final Clinical Impressions(s) / UC Diagnoses   Final diagnoses:  Laceration of right index finger without foreign body without damage to nail, initial encounter  Visit for suture removal     Discharge Instructions      Clean the area at least once a day with a good wound solution and apply an antibiotic ointment and nonstick dressing.  Elevate at rest to help with swelling.  Take the full course of antibiotics and follow-up in the next 2 days or so if not significantly improving.    ED Prescriptions     Medication Sig Dispense Auth. Provider   doxycycline  (VIBRAMYCIN ) 100 MG capsule Take 1 capsule (100 mg total) by mouth 2 (two) times daily. 14 capsule Stuart Vernell Norris, [place]      PDMP not reviewed this encounter.   Stuart Vernell Norris, [place] 01/31/24 [phone]

## 2024-02-04 DIAGNOSIS — M5412 Radiculopathy, cervical region: Secondary | ICD-10-CM | POA: Diagnosis not present

## 2024-02-13 DIAGNOSIS — M5412 Radiculopathy, cervical region: Secondary | ICD-10-CM | POA: Diagnosis not present

## 2024-02-21 ENCOUNTER — Encounter: Payer: Self-pay | Admitting: Family Medicine

## 2024-02-21 ENCOUNTER — Ambulatory Visit (INDEPENDENT_AMBULATORY_CARE_PROVIDER_SITE_OTHER): Admitting: Family Medicine

## 2024-02-21 VITALS — BP 152/81 | HR 92 | Wt 159.0 lb

## 2024-02-21 DIAGNOSIS — M25511 Pain in right shoulder: Secondary | ICD-10-CM

## 2024-02-21 DIAGNOSIS — K219 Gastro-esophageal reflux disease without esophagitis: Secondary | ICD-10-CM

## 2024-02-21 DIAGNOSIS — E559 Vitamin D deficiency, unspecified: Secondary | ICD-10-CM | POA: Diagnosis not present

## 2024-02-21 DIAGNOSIS — G8929 Other chronic pain: Secondary | ICD-10-CM | POA: Diagnosis not present

## 2024-02-21 DIAGNOSIS — E7849 Other hyperlipidemia: Secondary | ICD-10-CM | POA: Diagnosis not present

## 2024-02-21 DIAGNOSIS — E038 Other specified hypothyroidism: Secondary | ICD-10-CM

## 2024-02-21 DIAGNOSIS — R7301 Impaired fasting glucose: Secondary | ICD-10-CM

## 2024-02-21 DIAGNOSIS — Z1159 Encounter for screening for other viral diseases: Secondary | ICD-10-CM

## 2024-02-21 MED ORDER — GABAPENTIN 100 MG PO CAPS: 100.0000 mg | ORAL_CAPSULE | Freq: Three times a day (TID) | ORAL | 0 refills | Status: DC | PRN

## 2024-02-21 MED ORDER — ESOMEPRAZOLE MAGNESIUM 20 MG PO CPDR: 20.0000 mg | DELAYED_RELEASE_CAPSULE | Freq: Every day | ORAL | 1 refills | Status: DC

## 2024-02-21 MED ORDER — GABAPENTIN 100 MG PO CAPS: 100.0000 mg | ORAL_CAPSULE | Freq: Three times a day (TID) | ORAL | 0 refills | Status: AC | PRN

## 2024-02-21 MED ORDER — NAPROXEN 500 MG PO TABS: 500.0000 mg | ORAL_TABLET | Freq: Two times a day (BID) | ORAL | 0 refills | Status: AC

## 2024-02-21 NOTE — Assessment & Plan Note (Signed)
 Encouraged to continue care with orthopedics for right shoulder pain. Will initiate therapy with gabapentin  and naproxen  for pain relief; patient declines narcotics. Encouraged to follow up with orthopedics if symptoms worsen or fail to improve.

## 2024-02-21 NOTE — Patient Instructions (Addendum)
 I appreciate the opportunity to provide care to you today!    Follow up:  4 months  Labs: please stop by the lab during the week to get your blood drawn (CBC, CMP, TSH, Lipid profile, HgA1c, Vit D)  Screening: Hep C  For a Healthier YOU, I Recommend: Reducing your intake of sugar, sodium, carbohydrates, and saturated fats. Increasing your fiber intake by incorporating more whole grains, fruits, and vegetables into your meals. Setting healthy goals with a focus on lowering your consumption of carbs, sugar, and unhealthy fats. Adding variety to your diet by including a wide range of fruits and vegetables. Cutting back on soda and limiting processed foods as much as possible. Staying active: In addition to taking your weight loss medication, aim for at least 150 minutes of moderate-intensity physical activity each week for optimal results.    Please follow up if your symptoms worsen or fail to improve.    Please continue to a heart-healthy diet and increase your physical activities. Try to exercise for at least five days a week.    It was a pleasure to see you and I look forward to continuing to work together on your health and well-being. Please do not hesitate to call the office if you need care or have questions about your care.  In case of emergency, please visit the Emergency Department for urgent care, or contact our clinic at [phone] to schedule an appointment. We're here to help you!   Have a wonderful day and week. With Gratitude, Macallan Ord MSN, FNP-BC

## 2024-02-21 NOTE — Assessment & Plan Note (Signed)
 GERD diet encouraged Refilled nexium 

## 2024-02-21 NOTE — Progress Notes (Signed)
 New Patient Office Visit  Subjective:  Patient ID: Derek Ali, male    DOB: 08-28-1950  Age: 73 y.o. MRN: [id number]  CC:  Chief Complaint  Patient presents with   Establish Care   Pain    Located in the right arm, aches, hot feeling, burning as if on fire, etc was given pain medication hydrocodone via urgent care, pain at the time has been 9-10, pain also lated in the cervical region of neck, was given an injection in neck     HPI Derek Ali is a 73 y.o. male With a past medical history of right shoulder joint pain, cervical radiculopathy, neck pain, right arm pain, osteoarthritis of the left knee joint, and adhesive capsulitis of the left shoulder, the patient presents to establish care. He is currently under the care of [facility] and reports receiving steroid injections in the lumbar spine on 02/13/2024, with minimal relief thus far. He was advised that it may take up to two weeks for symptom improvement. He is scheduled to follow up with [facility] on 03/03/2024. The patient reports minimal relief of symptoms with Advil  and Tylenol  and continues to complain of burning, numbness, and tingling in the right shoulder joint.  Past Medical History:  Diagnosis Date   GERD (gastroesophageal reflux disease)    Hypertension     Past Surgical History:  Procedure Laterality Date   COLONOSCOPY N/A 12/05/2018   Procedure: COLONOSCOPY;  Surgeon: Derek Claudis PENNER, MD;  Location: [facility];  Service: Endoscopy;  Laterality: N/A;  830   COLONOSCOPY, ESOPHAGOGASTRODUODENOSCOPY (EGD) AND ESOPHAGEAL DILATION      Family History  Problem Relation Age of Onset   Diabetes Mother    Healthy Father     Social History   Socioeconomic History   Marital status: Married    Spouse name: Not on file   Number of children: Not on file   Years of education: Not on file   Highest education level: Associate degree: occupational, Scientist, product/process development, or vocational program  Occupational  History   Not on file  Tobacco Use   Smoking status: Never   Smokeless tobacco: Never  Substance and Sexual Activity   Alcohol use: Never   Drug use: Never   Sexual activity: Not Currently  Other Topics Concern   Not on file  Social History Narrative   Not on file   Social Drivers of Health   Financial Resource Strain: Low Risk  (02/14/2024)   Overall Financial Resource Strain (CARDIA)    Difficulty of Paying Living Expenses: Not hard at all  Food Insecurity: No Food Insecurity (02/14/2024)   Hunger Vital Sign    Worried About Running Out of Food in the Last Year: Never true    Ran Out of Food in the Last Year: Never true  Transportation Needs: No Transportation Needs (02/14/2024)   PRAPARE - Administrator, Civil Service (Medical): No    Lack of Transportation (Non-Medical): No  Physical Activity: Sufficiently Active (02/14/2024)   Exercise Vital Sign    Days of Exercise per Week: 7 days    Minutes of Exercise per Session: 150+ min  Stress: No Stress Concern Present (02/14/2024)   [employer] of Occupational Health - Occupational Stress Questionnaire    Feeling of Stress: Only a little  Social Connections: Socially Integrated (02/14/2024)   Social Connection and Isolation Panel    Frequency of Communication with Friends and Family: More than three times a week  Frequency of Social Gatherings with Friends and Family: Once a week    Attends Religious Services: More than 4 times per year    Active Member of [employer] or Organizations: Yes    Attends Banker Meetings: 1 to 4 times per year    Marital Status: Married  Catering manager Violence: Not on file    ROS Review of Systems  Constitutional:  Negative for fatigue and fever.  Eyes:  Negative for visual disturbance.  Respiratory:  Negative for chest tightness and shortness of breath.   Cardiovascular:  Negative for chest pain and palpitations.  Musculoskeletal:  Positive for arthralgias.   Neurological:  Negative for dizziness and headaches.    Objective:   Today's Vitals: BP (!) 152/81 (BP Location: Left Arm, Patient Position: Sitting, Cuff Size: Large)   Pulse 92   Wt 159 lb 0.6 oz (72.1 kg)   SpO2 96%   BMI 27.30 kg/m   Physical Exam HENT:     Head: Normocephalic.     Right Ear: External ear normal.     Left Ear: External ear normal.     Nose: No congestion or rhinorrhea.     Mouth/Throat:     Mouth: Mucous membranes are moist.  Cardiovascular:     Rate and Rhythm: Regular rhythm.     Heart sounds: No murmur heard. Pulmonary:     Effort: No respiratory distress.     Breath sounds: Normal breath sounds.  Musculoskeletal:     Right shoulder: No deformity or tenderness. Normal range of motion.  Neurological:     Mental Status: He is alert.      Assessment & Plan:   Chronic right shoulder pain Assessment & Plan: Encouraged to continue care with orthopedics for right shoulder pain. Will initiate therapy with gabapentin  and naproxen  for pain relief; patient declines narcotics. Encouraged to follow up with orthopedics if symptoms worsen or fail to improve.   Orders: -     Naproxen ; Take 1 tablet (500 mg total) by mouth 2 (two) times daily with a meal.  Dispense: 30 tablet; Refill: 0 -     Gabapentin ; Take 1 capsule (100 mg total) by mouth 3 (three) times daily as needed.  Dispense: 90 capsule; Refill: 0  Gastroesophageal reflux disease without esophagitis Assessment & Plan: GERD diet encouraged Refilled nexium   Orders: -     Esomeprazole  Magnesium ; Take 1 capsule (20 mg total) by mouth daily with breakfast.  Dispense: 60 capsule; Refill: 1  IFG (impaired fasting glucose) -     Hemoglobin A1c  Vitamin D deficiency -     VITAMIN D 25 Hydroxy (Vit-D Deficiency, Fractures)  Need for hepatitis C screening test -     Hepatitis C antibody  TSH (thyroid -stimulating hormone deficiency) -     TSH + free T4  Other hyperlipidemia -     Lipid panel -      CMP14+EGFR -     CBC with Differential/Platelet  Note: This chart has been completed using Engineer, civil (consulting) software, and while attempts have been made to ensure accuracy, certain words and phrases may not be transcribed as intended.     Follow-up: Return in about 4 months (around 06/22/2024).   Masiyah Engen, FNP

## 2024-02-27 DIAGNOSIS — Z1159 Encounter for screening for other viral diseases: Secondary | ICD-10-CM | POA: Diagnosis not present

## 2024-02-27 DIAGNOSIS — E7849 Other hyperlipidemia: Secondary | ICD-10-CM | POA: Diagnosis not present

## 2024-02-27 DIAGNOSIS — E559 Vitamin D deficiency, unspecified: Secondary | ICD-10-CM | POA: Diagnosis not present

## 2024-02-27 DIAGNOSIS — R7301 Impaired fasting glucose: Secondary | ICD-10-CM | POA: Diagnosis not present

## 2024-02-27 DIAGNOSIS — E038 Other specified hypothyroidism: Secondary | ICD-10-CM | POA: Diagnosis not present

## 2024-02-28 LAB — CMP14+EGFR
ALT: 28 IU/L (ref 0–44)
AST: 18 IU/L (ref 0–40)
Albumin: 4.4 g/dL (ref 3.9–4.9)
Alkaline Phosphatase: 67 IU/L (ref 44–121)
BUN/Creatinine Ratio: 17 (ref 10–24)
BUN: 19 mg/dL (ref 8–27)
Bilirubin Total: 0.4 mg/dL (ref 0.0–1.2)
CO2: 20 mmol/L (ref 20–29)
Calcium: 9.4 mg/dL (ref 8.6–10.2)
Chloride: 102 mmol/L (ref 96–106)
Creatinine, Ser: 1.1 mg/dL (ref 0.76–1.27)
Globulin, Total: 2.3 g/dL (ref 1.5–4.5)
Glucose: 116 mg/dL — ABNORMAL HIGH (ref 70–99)
Potassium: 4.6 mmol/L (ref 3.5–5.2)
Sodium: 138 mmol/L (ref 134–144)
Total Protein: 6.7 g/dL (ref 6.0–8.5)
eGFR: 72 mL/min/1.73 (ref >59)

## 2024-02-28 LAB — LIPID PANEL
Chol/HDL Ratio: 2.5 ratio (ref 0.0–5.0)
Cholesterol, Total: 130 mg/dL (ref 100–199)
HDL: 53 mg/dL (ref >39)
LDL Chol Calc (NIH): 61 mg/dL (ref 0–99)
Triglycerides: 83 mg/dL (ref 0–149)
VLDL Cholesterol Cal: 16 mg/dL (ref 5–40)

## 2024-02-28 LAB — HEMOGLOBIN A1C
Est. average glucose Bld gHb Est-mCnc: 134 mg/dL
Hgb A1c MFr Bld: 6.3 % — ABNORMAL HIGH (ref 4.8–5.6)

## 2024-02-28 LAB — CBC WITH DIFFERENTIAL/PLATELET
Basophils Absolute: 0 x10E3/uL (ref 0.0–0.2)
Basos: 1 %
EOS (ABSOLUTE): 0.2 x10E3/uL (ref 0.0–0.4)
Eos: 2 %
Hematocrit: 41.8 % (ref 37.5–51.0)
Hemoglobin: 14.1 g/dL (ref 13.0–17.7)
Immature Grans (Abs): 0 x10E3/uL (ref 0.0–0.1)
Immature Granulocytes: 0 %
Lymphocytes Absolute: 2 x10E3/uL (ref 0.7–3.1)
Lymphs: 25 %
MCH: 34.1 pg — ABNORMAL HIGH (ref 26.6–33.0)
MCHC: 33.7 g/dL (ref 31.5–35.7)
MCV: 101 fL — ABNORMAL HIGH (ref 79–97)
Monocytes Absolute: 0.8 x10E3/uL (ref 0.1–0.9)
Monocytes: 10 %
Neutrophils Absolute: 5 x10E3/uL (ref 1.4–7.0)
Neutrophils: 62 %
Platelets: 239 x10E3/uL (ref 150–450)
RBC: 4.13 x10E6/uL — ABNORMAL LOW (ref 4.14–5.80)
RDW: 12.8 % (ref 11.6–15.4)
WBC: 8 x10E3/uL (ref 3.4–10.8)

## 2024-02-28 LAB — TSH+FREE T4
Free T4: 1.1 ng/dL (ref 0.82–1.77)
TSH: 1.65 u[IU]/mL (ref 0.450–4.500)

## 2024-02-28 LAB — HEPATITIS C ANTIBODY: Hep C Virus Ab: NONREACTIVE

## 2024-02-28 LAB — VITAMIN D 25 HYDROXY (VIT D DEFICIENCY, FRACTURES): Vit D, 25-Hydroxy: 26.2 ng/mL — ABNORMAL LOW (ref 30.0–100.0)

## 2024-03-01 ENCOUNTER — Ambulatory Visit: Payer: Self-pay | Admitting: Family Medicine

## 2024-03-01 DIAGNOSIS — E559 Vitamin D deficiency, unspecified: Secondary | ICD-10-CM

## 2024-03-01 MED ORDER — VITAMIN D (ERGOCALCIFEROL) 1.25 MG (50000 UNIT) PO CAPS: 50000.0000 [IU] | ORAL_CAPSULE | ORAL | 1 refills | Status: AC

## 2024-03-03 DIAGNOSIS — M5412 Radiculopathy, cervical region: Secondary | ICD-10-CM | POA: Diagnosis not present

## 2024-03-03 DIAGNOSIS — M542 Cervicalgia: Secondary | ICD-10-CM | POA: Diagnosis not present

## 2024-03-04 DIAGNOSIS — H524 Presbyopia: Secondary | ICD-10-CM | POA: Diagnosis not present

## 2024-03-04 DIAGNOSIS — D3131 Benign neoplasm of right choroid: Secondary | ICD-10-CM | POA: Diagnosis not present

## 2024-03-21 DIAGNOSIS — M5412 Radiculopathy, cervical region: Secondary | ICD-10-CM | POA: Diagnosis not present

## 2024-03-31 ENCOUNTER — Ambulatory Visit (INDEPENDENT_AMBULATORY_CARE_PROVIDER_SITE_OTHER)

## 2024-03-31 DIAGNOSIS — Z23 Encounter for immunization: Secondary | ICD-10-CM

## 2024-03-31 NOTE — Progress Notes (Signed)
 Patient is in office today for a nurse visit for Immunization. Patient Injection was given in the  Left deltoid. Patient tolerated injection well.

## 2024-04-07 DIAGNOSIS — Z85828 Personal history of other malignant neoplasm of skin: Secondary | ICD-10-CM | POA: Diagnosis not present

## 2024-04-07 DIAGNOSIS — L573 Poikiloderma of Civatte: Secondary | ICD-10-CM | POA: Diagnosis not present

## 2024-04-07 DIAGNOSIS — L821 Other seborrheic keratosis: Secondary | ICD-10-CM | POA: Diagnosis not present

## 2024-04-07 DIAGNOSIS — L570 Actinic keratosis: Secondary | ICD-10-CM | POA: Diagnosis not present

## 2024-04-29 DIAGNOSIS — M542 Cervicalgia: Secondary | ICD-10-CM | POA: Diagnosis not present

## 2024-04-29 DIAGNOSIS — M5412 Radiculopathy, cervical region: Secondary | ICD-10-CM | POA: Diagnosis not present

## 2024-05-06 DIAGNOSIS — M542 Cervicalgia: Secondary | ICD-10-CM | POA: Diagnosis not present

## 2024-05-06 DIAGNOSIS — M5412 Radiculopathy, cervical region: Secondary | ICD-10-CM | POA: Diagnosis not present

## 2024-05-06 DIAGNOSIS — M25512 Pain in left shoulder: Secondary | ICD-10-CM | POA: Diagnosis not present

## 2024-05-27 DIAGNOSIS — M5412 Radiculopathy, cervical region: Secondary | ICD-10-CM | POA: Diagnosis not present

## 2024-06-23 ENCOUNTER — Ambulatory Visit: Admitting: Family Medicine

## 2024-07-04 ENCOUNTER — Ambulatory Visit: Admitting: Family Medicine

## 2024-07-23 ENCOUNTER — Other Ambulatory Visit: Payer: Self-pay | Admitting: Family Medicine

## 2024-07-23 DIAGNOSIS — K219 Gastro-esophageal reflux disease without esophagitis: Secondary | ICD-10-CM

## 2024-08-07 ENCOUNTER — Ambulatory Visit: Admitting: Family Medicine
